# Patient Record
Sex: Male | Born: 1955 | Race: Black or African American | Hispanic: No | Marital: Married | Smoking: Former smoker
Health system: Southern US, Community
[De-identification: ages and names within clinical notes are randomized; demographics above are authoritative.]

## PROBLEM LIST (undated history)

## (undated) DIAGNOSIS — R972 Elevated prostate specific antigen [PSA]: Secondary | ICD-10-CM

## (undated) DIAGNOSIS — M1990 Unspecified osteoarthritis, unspecified site: Secondary | ICD-10-CM

## (undated) DIAGNOSIS — N529 Male erectile dysfunction, unspecified: Secondary | ICD-10-CM

## (undated) HISTORY — DX: Male erectile dysfunction, unspecified: N52.9

## (undated) HISTORY — DX: Elevated prostate specific antigen (PSA): R97.20

## (undated) HISTORY — DX: Unspecified osteoarthritis, unspecified site: M19.90

---

## 2013-05-13 ENCOUNTER — Emergency Department (HOSPITAL_COMMUNITY)
Admission: EM | Admit: 2013-05-13 | Discharge: 2013-05-13 | Disposition: A | Discharge status: Home Health | Payer: Managed Care, Other (non HMO) | Attending: Emergency Medicine | Admitting: Emergency Medicine

## 2013-05-13 ENCOUNTER — Encounter (HOSPITAL_COMMUNITY): Payer: Self-pay | Admitting: Emergency Medicine

## 2013-05-13 ENCOUNTER — Emergency Department (HOSPITAL_COMMUNITY): Payer: Managed Care, Other (non HMO)

## 2013-05-13 DIAGNOSIS — X58XXXA Exposure to other specified factors, initial encounter: Secondary | ICD-10-CM | POA: Insufficient documentation

## 2013-05-13 DIAGNOSIS — S0300XA Dislocation of jaw, unspecified side, initial encounter: Secondary | ICD-10-CM

## 2013-05-13 DIAGNOSIS — Y939 Activity, unspecified: Secondary | ICD-10-CM | POA: Insufficient documentation

## 2013-05-13 DIAGNOSIS — Y929 Unspecified place or not applicable: Secondary | ICD-10-CM | POA: Insufficient documentation

## 2013-05-13 MED ORDER — FENTANYL CITRATE 0.05 MG/ML IJ SOLN
100.0000 ug | Freq: Once | INTRAMUSCULAR | Status: AC
  Administered 2013-05-13: 50 ug via INTRAVENOUS

## 2013-05-13 MED ORDER — MIDAZOLAM HCL 2 MG/2ML IJ SOLN
INTRAMUSCULAR | Status: AC
  Administered 2013-05-13: 4 mg via INTRAVENOUS
  Filled 2013-05-13: qty 10

## 2013-05-13 MED ORDER — FLUMAZENIL 0.5 MG/5ML IV SOLN
INTRAVENOUS | Status: AC
  Administered 2013-05-13: 0.3 mg
  Filled 2013-05-13: qty 5

## 2013-05-13 MED ORDER — FENTANYL CITRATE 0.05 MG/ML IJ SOLN
INTRAMUSCULAR | Status: AC
  Administered 2013-05-13: 50 ug via INTRAVENOUS
  Filled 2013-05-13: qty 2

## 2013-05-13 MED ORDER — MIDAZOLAM HCL 2 MG/2ML IJ SOLN
1.0000 mg | Freq: Once | INTRAMUSCULAR | Status: AC
  Administered 2013-05-13: 1 mg via INTRAVENOUS

## 2013-05-13 MED ORDER — FENTANYL CITRATE 0.05 MG/ML IJ SOLN
50.0000 ug | Freq: Once | INTRAMUSCULAR | Status: AC
  Administered 2013-05-13: 50 ug via INTRAVENOUS

## 2013-05-13 MED ORDER — MIDAZOLAM HCL 2 MG/2ML IJ SOLN
4.0000 mg | Freq: Once | INTRAMUSCULAR | Status: AC
  Administered 2013-05-13: 4 mg via INTRAVENOUS

## 2013-05-13 MED ORDER — SODIUM CHLORIDE 0.9 % IV SOLN
INTRAVENOUS | Status: DC
  Administered 2013-05-13: 125 mL/h via INTRAVENOUS

## 2013-05-13 NOTE — ED Notes (Signed)
Pt taking po fluids without provlem.  States his pain is now relieved.

## 2013-05-13 NOTE — ED Notes (Signed)
Pt awake and talking with wife. Able to swallow and talk

## 2013-05-13 NOTE — ED Notes (Signed)
Patient denies pain and is resting comfortably.  

## 2013-05-13 NOTE — ED Provider Notes (Signed)
CSN: [id number]     Arrival date & time 05/13/13  1205 History   First MD Initiated Contact with Patient 05/13/13 1314     Chief Complaint  Patient presents with  . Jaw Pain   (Consider location/radiation/quality/duration/timing/severity/associated sxs/prior Treatment) The history is provided by the patient.   patient here complaining of inability to close his mouth. He went to the dentist office today to have filling and does have a prior history of jaw dislocation and open up his mouth he was unable to close it. Spoke with his dentist who says that he tried to use traction to relocate the jaw but was unsuccessful. Patient presents here now for definitive treatment  History reviewed. No pertinent past medical history. History reviewed. No pertinent past surgical history. History reviewed. No pertinent family history. History  Substance Use Topics  . Smoking status: Not on file  . Smokeless tobacco: Not on file  . Alcohol Use: No    Review of Systems  All other systems reviewed and are negative.    Allergies  Review of patient's allergies indicates not on file.  Home Medications  No current outpatient prescriptions on file. BP 156/88  Pulse 67  Temp(Src) 98.5 F (36.9 C) (Oral)  Resp 20  SpO2 97% Physical Exam  Nursing note and vitals reviewed. Constitutional: He is oriented to person, place, and time. He appears well-developed and well-nourished.  Non-toxic appearance. No distress.  HENT:  Head: Normocephalic and atraumatic.  Tender at the right condyle. Patient unable to close his mouth.  Eyes: Conjunctivae, EOM and lids are normal. Pupils are equal, round, and reactive to light.  Neck: Normal range of motion. Neck supple. No tracheal deviation present. No mass present.  Cardiovascular: Normal rate, regular rhythm and normal heart sounds.  Exam reveals no gallop.   No murmur heard. Pulmonary/Chest: Effort normal and breath sounds normal. No stridor. No respiratory  distress. He has no decreased breath sounds. He has no wheezes. He has no rhonchi. He has no rales.  Abdominal: Soft. Normal appearance and bowel sounds are normal. He exhibits no distension. There is no tenderness. There is no rebound and no CVA tenderness.  Musculoskeletal: Normal range of motion. He exhibits no edema and no tenderness.  Neurological: He is alert and oriented to person, place, and time. He has normal strength. No cranial nerve deficit or sensory deficit. GCS eye subscore is 4. GCS verbal subscore is 5. GCS motor subscore is 6.  Skin: Skin is warm and dry. No abrasion and no rash noted.  Psychiatric: He has a normal mood and affect. His speech is normal and behavior is normal.    ED Course  Procedural sedation Date/Time: 05/13/2013 2:57 PM Performed by: Toy Baker Authorized by: Lorre Nick T Consent: Verbal consent obtained. written consent obtained. Consent given by: patient Patient identity confirmed: verbally with patient Time out: Immediately prior to procedure a "time out" was called to verify the correct patient, procedure, equipment, support staff and site/side marked as required. Preparation: Patient was prepped and draped in the usual sterile fashion. Patient sedated: yes Sedatives: midazolam Analgesia: fentanyl Sedation start date/time: 05/13/2013 2:20 PM Sedation end date/time: 05/13/2013 2:35 PM Vitals: Vital signs were monitored during sedation. Patient tolerance: Patient tolerated the procedure well with no immediate complications.  Reduction of dislocation Date/Time: 05/13/2013 3:04 PM Performed by: Toy Baker Authorized by: Lorre Nick T Consent: Verbal consent obtained. written consent obtained. Risks and benefits: risks, benefits and alternatives were discussed Consent  given by: patient Patient identity confirmed: verbally with patient Patient tolerance: Patient tolerated the procedure well with no immediate complications.    (including critical care time) Labs Review Labs Reviewed - No data to display Imaging Review No results found.  MDM  No diagnosis found. 3:07 PM Patient is alert and oriented x4 at time of discharge. Airway is intact. He is able to take fluids without difficulty.    Toy Baker, MD 05/13/13 [phone]

## 2013-05-13 NOTE — ED Notes (Signed)
Procedure ended. Pt drowsy but arousable with physical stimuli

## 2013-05-13 NOTE — ED Notes (Signed)
1400 meds given per order of Dr. Freida Busman

## 2013-05-13 NOTE — ED Notes (Signed)
Consent signed for procedure, wife at bedside.

## 2013-05-13 NOTE — ED Notes (Signed)
Time out completed and procedure started

## 2013-05-13 NOTE — ED Notes (Signed)
Pt to monitor, O2 in place via [place] at 2lpm

## 2013-05-13 NOTE — ED Notes (Signed)
Wife's pt. Stated, he went to dentist this am and his jaw locked, they tried at the dentist office but unable to unlock it.

## 2013-05-13 NOTE — ED Notes (Signed)
Pt remains alert, oriented and awake, wife remains at bedside

## 2013-05-13 NOTE — ED Notes (Signed)
Pt awake and sipping drink

## 2018-01-17 ENCOUNTER — Ambulatory Visit: Payer: 59 | Admitting: Family Medicine

## 2018-01-17 ENCOUNTER — Telehealth: Payer: Self-pay | Admitting: Family Medicine

## 2018-01-17 ENCOUNTER — Encounter: Payer: Self-pay | Admitting: Family Medicine

## 2018-01-17 VITALS — BP 126/80 | HR 81 | Ht 72.0 in | Wt 170.4 lb

## 2018-01-17 DIAGNOSIS — N529 Male erectile dysfunction, unspecified: Secondary | ICD-10-CM | POA: Diagnosis not present

## 2018-01-17 DIAGNOSIS — Z1211 Encounter for screening for malignant neoplasm of colon: Secondary | ICD-10-CM | POA: Insufficient documentation

## 2018-01-17 DIAGNOSIS — Z0001 Encounter for general adult medical examination with abnormal findings: Secondary | ICD-10-CM

## 2018-01-17 DIAGNOSIS — Z0000 Encounter for general adult medical examination without abnormal findings: Secondary | ICD-10-CM | POA: Diagnosis not present

## 2018-01-17 LAB — CBC
HCT: 41.7 % (ref 39.0–52.0)
Hemoglobin: 13.8 g/dL (ref 13.0–17.0)
MCHC: 33 g/dL (ref 30.0–36.0)
MCV: 89 fl (ref 78.0–100.0)
PLATELETS: 223 10*3/uL (ref 150.0–400.0)
RBC: 4.69 Mil/uL (ref 4.22–5.81)
RDW: 14 % (ref 11.5–15.5)
WBC: 3.9 10*3/uL — ABNORMAL LOW (ref 4.0–10.5)

## 2018-01-17 LAB — LIPID PANEL
CHOLESTEROL: 141 mg/dL (ref 0–200)
HDL: 55 mg/dL (ref >39.00)
LDL CALC: 77 mg/dL (ref 0–99)
NonHDL: 86.37
Total CHOL/HDL Ratio: 3
Triglycerides: 49 mg/dL (ref 0.0–149.0)
VLDL: 9.8 mg/dL (ref 0.0–40.0)

## 2018-01-17 LAB — URINALYSIS, ROUTINE W REFLEX MICROSCOPIC
Bilirubin Urine: NEGATIVE
HGB URINE DIPSTICK: NEGATIVE
KETONES UR: NEGATIVE
LEUKOCYTES UA: NEGATIVE
NITRITE: NEGATIVE
RBC / HPF: NONE SEEN (ref 0–?)
SPECIFIC GRAVITY, URINE: 1.02 (ref 1.000–1.030)
Total Protein, Urine: NEGATIVE
Urine Glucose: NEGATIVE
Urobilinogen, UA: 1 (ref 0.0–1.0)
pH: 6.5 (ref 5.0–8.0)

## 2018-01-17 LAB — COMPREHENSIVE METABOLIC PANEL
ALBUMIN: 3.9 g/dL (ref 3.5–5.2)
ALK PHOS: 45 U/L (ref 39–117)
ALT: 11 U/L (ref 0–53)
AST: 13 U/L (ref 0–37)
BUN: 12 mg/dL (ref 6–23)
CALCIUM: 9 mg/dL (ref 8.4–10.5)
CHLORIDE: 104 meq/L (ref 96–112)
CO2: 32 mEq/L (ref 19–32)
CREATININE: 1.02 mg/dL (ref 0.40–1.50)
GFR: 95.9 mL/min (ref >60.00)
Glucose, Bld: 99 mg/dL (ref 70–99)
Potassium: 4.6 mEq/L (ref 3.5–5.1)
Sodium: 140 mEq/L (ref 135–145)
Total Bilirubin: 0.7 mg/dL (ref 0.2–1.2)
Total Protein: 6.2 g/dL (ref 6.0–8.3)

## 2018-01-17 LAB — PSA: PSA: 0.98 ng/mL (ref 0.10–4.00)

## 2018-01-17 MED ORDER — SILDENAFIL CITRATE 20 MG PO TABS: ORAL_TABLET | ORAL | 0 refills | Status: DC

## 2018-01-17 NOTE — Patient Instructions (Signed)
Erectile Dysfunction Erectile dysfunction (ED) is the inability to get or keep an erection in order to have sexual intercourse. Erectile dysfunction may include:  Inability to get an erection.  Lack of enough hardness of the erection to allow penetration.  Loss of the erection before sex is finished.  What are the causes? This condition may be caused by:  Certain medicines, such as: ? Pain relievers. ? Antihistamines. ? Antidepressants. ? Blood pressure medicines. ? Water pills (diuretics). ? Ulcer medicines. ? Muscle relaxants. ? Drugs.  Excessive drinking.  Psychological causes, such as: ? Anxiety. ? Depression. ? Sadness. ? Exhaustion. ? Performance fear. ? Stress.  Physical causes, such as: ? Artery problems. This may include diabetes, smoking, liver disease, or atherosclerosis. ? High blood pressure. ? Hormonal problems, such as low testosterone. ? Obesity. ? Nerve problems. This may include back or pelvic injuries, diabetes mellitus, multiple sclerosis, or Parkinson disease.  What are the signs or symptoms? Symptoms of this condition include:  Inability to get an erection.  Lack of enough hardness of the erection to allow penetration.  Loss of the erection before sex is finished.  Normal erections at some times, but with frequent unsatisfactory episodes.  Low sexual satisfaction in either partner due to erection problems.  A curved penis occurring with erection. The curve may cause pain or the penis may be too curved to allow for intercourse.  Never having nighttime erections.  How is this diagnosed? This condition is often diagnosed by:  Performing a physical exam to find other diseases or specific problems with the penis.  Asking you detailed questions about the problem.  Performing blood tests to check for diabetes mellitus or to measure hormone levels.  Performing other tests to check for underlying health conditions.  Performing an  ultrasound exam to check for scarring.  Performing a test to check blood flow to the penis.  Doing a sleep study at home to measure nighttime erections.  How is this treated? This condition may be treated by:  Medicine taken by mouth to help you achieve an erection (oral medicine).  Hormone replacement therapy to replace low testosterone levels.  Medicine that is injected into the penis. Your health care provider may instruct you how to give yourself these injections at home.  Vacuum pump. This is a pump with a ring on it. The pump and ring are placed on the penis and used to create pressure that helps the penis become erect.  Penile implant surgery. In this procedure, you may receive: ? An inflatable implant. This consists of cylinders, a pump, and a reservoir. The cylinders can be inflated with a fluid that helps to create an erection, and they can be deflated after intercourse. ? A semi-rigid implant. This consists of two silicone rubber rods. The rods provide some rigidity. They are also flexible, so the penis can both curve downward in its normal position and become straight for sexual intercourse.  Blood vessel surgery, to improve blood flow to the penis. During this procedure, a blood vessel from a different part of the body is placed into the penis to allow blood to flow around (bypass) damaged or blocked blood vessels.  Lifestyle changes, such as exercising more, losing weight, and quitting smoking.  Follow these instructions at home: Medicines  Take over-the-counter and prescription medicines only as told by your health care provider. Do not increase the dosage without first discussing it with your health care provider.  If you are using self-injections, perform injections   as directed by your health care provider. Make sure to avoid any veins that are on the surface of the penis. After giving an injection, apply pressure to the injection site for 5 minutes. General  instructions  Exercise regularly, as directed by your health care provider. Work with your health care provider to lose weight, if needed.  Do not use any products that contain nicotine or tobacco, such as cigarettes and e-cigarettes. If you need help quitting, ask your health care provider.  Before using a vacuum pump, read the instructions that come with the pump and discuss any questions with your health care provider.  Keep all follow-up visits as told by your health care provider. This is important. Contact a health care provider if:  You feel nauseous.  You vomit. Get help right away if:  You are taking oral or injectable medicines and you have an erection that lasts longer than 4 hours. If your health care provider is unavailable, go to the nearest emergency room for evaluation. An erection that lasts much longer than 4 hours can result in permanent damage to your penis.  You have severe pain in your groin or abdomen.  You develop redness or severe swelling of your penis.  You have redness spreading up into your groin or lower abdomen.  You are unable to urinate.  You experience chest pain or a rapid heart beat (palpitations) after taking oral medicines. Summary  Erectile dysfunction (ED) is the inability to get or keep an erection during sexual intercourse. This problem can usually be treated successfully.  This condition is diagnosed based on a physical exam, your symptoms, and tests to determine the cause. Treatment varies depending on the cause, and may include medicines, hormone therapy, surgery, or vacuum pump.  You may need follow-up visits to make sure that you are using your medicines or devices correctly.  Get help right away if you are taking or injecting medicines and you have an erection that lasts longer than 4 hours. This information is not intended to replace advice given to you by your health care provider. Make sure you discuss any questions you have with  your health care provider. Document Released: 08/21/2000 Document Revised: 09/09/2016 Document Reviewed: 09/09/2016 Elsevier Interactive Patient Education  2017 Elsevier Inc.  Health Maintenance, Male A healthy lifestyle and preventive care is important for your health and wellness. Ask your health care provider about what schedule of regular examinations is right for you. What should I know about weight and diet? Eat a Healthy Diet  Eat plenty of vegetables, fruits, whole grains, low-fat dairy products, and lean protein.  Do not eat a lot of foods high in solid fats, added sugars, or salt.  Maintain a Healthy Weight Regular exercise can help you achieve or maintain a healthy weight. You should:  Do at least 150 minutes of exercise each week. The exercise should increase your heart rate and make you sweat (moderate-intensity exercise).  Do strength-training exercises at least twice a week.  Watch Your Levels of Cholesterol and Blood Lipids  Have your blood tested for lipids and cholesterol every 5 years starting at 60 years of age. If you are at high risk for heart disease, you should start having your blood tested when you are 60 years old. You may need to have your cholesterol levels checked more often if: ? Your lipid or cholesterol levels are high. ? You are older than 60 years of age. ? You are at high risk for heart   disease.  What should I know about cancer screening? Many types of cancers can be detected early and may often be prevented. Lung Cancer  You should be screened every year for lung cancer if: ? You are a current smoker who has smoked for at least 30 years. ? You are a former smoker who has quit within the past 15 years.  Talk to your health care provider about your screening options, when you should start screening, and how often you should be screened.  Colorectal Cancer  Routine colorectal cancer screening usually begins at 60 years of age and should be  repeated every 5-10 years until you are 60 years old. You may need to be screened more often if early forms of precancerous polyps or small growths are found. Your health care provider may recommend screening at an earlier age if you have risk factors for colon cancer.  Your health care provider may recommend using home test kits to check for hidden blood in the stool.  A small camera at the end of a tube can be used to examine your colon (sigmoidoscopy or colonoscopy). This checks for the earliest forms of colorectal cancer.  Prostate and Testicular Cancer  Depending on your age and overall health, your health care provider may do certain tests to screen for prostate and testicular cancer.  Talk to your health care provider about any symptoms or concerns you have about testicular or prostate cancer.  Skin Cancer  Check your skin from head to toe regularly.  Tell your health care provider about any new moles or changes in moles, especially if: ? There is a change in a mole's size, shape, or color. ? You have a mole that is larger than a pencil eraser.  Always use sunscreen. Apply sunscreen liberally and repeat throughout the day.  Protect yourself by wearing long sleeves, pants, a wide-brimmed hat, and sunglasses when outside.  What should I know about heart disease, diabetes, and high blood pressure?  If you are 18-39 years of age, have your blood pressure checked every 3-5 years. If you are 40 years of age or older, have your blood pressure checked every year. You should have your blood pressure measured twice-once when you are at a hospital or clinic, and once when you are not at a hospital or clinic. Record the average of the two measurements. To check your blood pressure when you are not at a hospital or clinic, you can use: ? An automated blood pressure machine at a pharmacy. ? A home blood pressure monitor.  Talk to your health care provider about your target blood  pressure.  If you are between 45-79 years old, ask your health care provider if you should take aspirin to prevent heart disease.  Have regular diabetes screenings by checking your fasting blood sugar level. ? If you are at a normal weight and have a low risk for diabetes, have this test once every three years after the age of 45. ? If you are overweight and have a high risk for diabetes, consider being tested at a younger age or more often.  A one-time screening for abdominal aortic aneurysm (AAA) by ultrasound is recommended for men aged 65-75 years who are current or former smokers. What should I know about preventing infection? Hepatitis B If you have a higher risk for hepatitis B, you should be screened for this virus. Talk with your health care provider to find out if you are at risk for hepatitis B   infection. Hepatitis C Blood testing is recommended for:  Everyone born from 1945 through 1965.  Anyone with known risk factors for hepatitis C.  Sexually Transmitted Diseases (STDs)  You should be screened each year for STDs including gonorrhea and chlamydia if: ? You are sexually active and are younger than 60 years of age. ? You are older than 60 years of age and your health care provider tells you that you are at risk for this type of infection. ? Your sexual activity has changed since you were last screened and you are at an increased risk for chlamydia or gonorrhea. Ask your health care provider if you are at risk.  Talk with your health care provider about whether you are at high risk of being infected with HIV. Your health care provider may recommend a prescription medicine to help prevent HIV infection.  What else can I do?  Schedule regular health, dental, and eye exams.  Stay current with your vaccines (immunizations).  Do not use any tobacco products, such as cigarettes, chewing tobacco, and e-cigarettes. If you need help quitting, ask your health care  provider.  Limit alcohol intake to no more than 2 drinks per day. One drink equals 12 ounces of beer, 5 ounces of wine, or 1 ounces of hard liquor.  Do not use street drugs.  Do not share needles.  Ask your health care provider for help if you need support or information about quitting drugs.  Tell your health care provider if you often feel depressed.  Tell your health care provider if you have ever been abused or do not feel safe at home. This information is not intended to replace advice given to you by your health care provider. Make sure you discuss any questions you have with your health care provider. Document Released: 02/20/2008 Document Revised: 04/22/2016 Document Reviewed: 05/28/2015 Elsevier Interactive Patient Education  2018 Elsevier Inc.  

## 2018-01-17 NOTE — Telephone Encounter (Signed)
Copied from CRM [id number]. Topic: Quick Communication - Rx Refill/Question >> Jan 17, 2018  4:04 PM Landry Mellow wrote: Medication: sildenafil (REVATIO) 20 MG tablet    Has the patient contacted their pharmacy? Yes.  Pharm called  (Agent: If no, request that the patient contact the pharmacy for the refill.) Preferred Pharmacy (with phone number or street name): [facility] - [place], [place] [address] Agent: Please be advised that RX refills may take up to 3 business days. We ask that you follow-up with your pharmacy.  Medication was denied at [facility] because is has to be filled through this pharmacy instead in order to be covered.

## 2018-01-17 NOTE — Progress Notes (Signed)
Subjective:  Patient ID: Raymond Stein, male    DOB: [birth year]  Age: 62 y.o. MRN: [id number]  CC: Establish Care   HPI Raymond Stein presents for a physical exam.  He is accompanied by his wife.  Their children are grown and some of them live locally.  He does not smoke, drink alcohol or use illicit drugs.  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  He is active on his job but has no regular exercise program.  His father lived to age 57 before having a massive heart attack.  His mom died at age 3 from uterine cancer.  He had a colonoscopy 5 years ago.  His urine flow is good he does not have nocturia.  He is experiencing some issues with achieving an erection.  His libido is excellent.  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  They have a child who is a type I diabetic.  History Raymond Stein has no past medical history on file.   He has no past surgical history on file.   His family history is not on file.He reports that he has quit smoking. He has never used smokeless tobacco. He reports that he does not drink alcohol. His drug history is not on file.  No outpatient medications prior to visit.   No facility-administered medications prior to visit.     ROS Review of Systems  Constitutional: Negative for chills, fatigue, fever and unexpected weight change.  HENT: Negative.   Eyes: Negative for photophobia and visual disturbance.  Respiratory: Negative for apnea, cough and wheezing.   Cardiovascular: Negative for chest pain and palpitations.  Gastrointestinal: Negative.   Endocrine: Negative for polyphagia and polyuria.  Genitourinary: Negative for difficulty urinating, frequency and urgency.  Musculoskeletal: Negative for arthralgias and myalgias.  Skin: Negative for color change and rash.  Allergic/Immunologic: Negative for immunocompromised state.  Neurological: Negative for weakness, numbness and headaches.  Hematological: Does not bruise/bleed easily.    Psychiatric/Behavioral: Negative.     Objective:  BP 126/80   Pulse 81   Ht 6' (1.829 m)   Wt 170 lb 6 oz (77.3 kg)   SpO2 98%   BMI 23.11 kg/m   Physical Exam  Constitutional: He is oriented to person, place, and time. He appears well-developed and well-nourished. No distress.  HENT:  Head: Normocephalic and atraumatic.  Right Ear: External ear normal.  Left Ear: External ear normal.  Nose: Nose normal.  Mouth/Throat: Oropharynx is clear and moist. No oropharyngeal exudate.  Eyes: Pupils are equal, round, and reactive to light. Conjunctivae and EOM are normal. Right eye exhibits no discharge. Left eye exhibits no discharge. No scleral icterus.  Neck: Normal range of motion. Neck supple. No JVD present. No tracheal deviation present. No thyromegaly present.  Cardiovascular: Normal rate, regular rhythm and normal heart sounds.  Pulmonary/Chest: Effort normal and breath sounds normal.  Abdominal: Soft. Bowel sounds are normal. He exhibits no distension. There is no tenderness. There is no guarding.  Genitourinary: Rectal exam shows no external hemorrhoid, no internal hemorrhoid, no fissure, no mass, no tenderness, anal tone normal and guaiac negative stool. Prostate is enlarged. Prostate is not tender.  Musculoskeletal: He exhibits no edema or deformity.  Lymphadenopathy:    He has no cervical adenopathy.  Neurological: He is alert and oriented to person, place, and time.  Skin: Skin is warm and dry. Capillary refill takes less than 2 seconds. No rash noted. He is not diaphoretic.  No erythema. No pallor.  Psychiatric: He has a normal mood and affect. His behavior is normal.      Assessment & Plan:   Raymond Stein was seen today for establish care.  Diagnoses and all orders for this visit:  Encounter for health maintenance examination with abnormal findings -     CBC -     Comprehensive metabolic panel -     HIV antibody -     Lipid panel -     PSA -     Urinalysis, Routine w  reflex microscopic  Vasculogenic erectile dysfunction, unspecified vasculogenic erectile dysfunction type -     Lipid panel -     PSA -     sildenafil (REVATIO) 20 MG tablet; Take 1-3 tablets 45 minutes prior   I am having Raymond Stein start on sildenafil.  Meds ordered this encounter  Medications  . sildenafil (REVATIO) 20 MG tablet    Sig: Take 1-3 tablets 45 minutes prior    Dispense:  50 tablet    Refill:  0   Encouraged the patient to start a dedicated exercise program for 30 minutes at least 5 days a week.  Information for prevention and health maintenance was given to the patient as well.  Follow-up: Return in about 3 months (around 04/19/2018).  Mliss Sax, MD

## 2018-01-17 NOTE — Telephone Encounter (Unsigned)
Copied from CRM #[id number]. Topic: Quick Communication - Rx Refill/Question °>> Jan 17, 2018  4:04 PM Foltz, Melissa J wrote: °Medication: sildenafil (REVATIO) 20 MG tablet  ° ° °Has the patient contacted their pharmacy? Yes.  Pharm called  °(Agent: If no, request that the patient contact the pharmacy for the refill.) °Preferred Pharmacy (with phone number or street name): [facility] - [place], [place] - [address] °Agent: Please be advised that RX refills may take up to 3 business days. We ask that you follow-up with your pharmacy. ° °Medication was denied at [facility] because is has to be filled through this pharmacy instead in order to be covered.  °

## 2018-01-18 ENCOUNTER — Other Ambulatory Visit: Payer: Self-pay

## 2018-01-18 ENCOUNTER — Telehealth: Payer: Self-pay | Admitting: Family Medicine

## 2018-01-18 DIAGNOSIS — N529 Male erectile dysfunction, unspecified: Secondary | ICD-10-CM

## 2018-01-18 LAB — HIV ANTIBODY (ROUTINE TESTING W REFLEX): HIV 1&2 Ab, 4th Generation: NONREACTIVE

## 2018-01-18 MED ORDER — SILDENAFIL CITRATE 20 MG PO TABS: ORAL_TABLET | ORAL | 0 refills | Status: DC

## 2018-01-18 NOTE — Telephone Encounter (Signed)
Copied from CRM #[id number]. Topic: Quick Communication - See Telephone Encounter >> Jan 18, 2018  9:57 AM Rudi Coco, NT wrote: CRM for notification. See Telephone encounter for: 01/18/18.  Pre auth needed for med. sildenafil (REVATIO) 20 MG tablet [[id number]]   [facility] - [place], [place] - [address] [place] [address] Phone: [phone] Fax: [phone]

## 2018-01-18 NOTE — Telephone Encounter (Signed)
PA is pending via covermymeds. Key: [id number]  Please allow at least 72 hours for a response from insurance company.

## 2018-01-18 NOTE — Telephone Encounter (Signed)
Duplicate encounter

## 2018-01-18 NOTE — Telephone Encounter (Addendum)
I called and spoke with patient's preferred pharmacy. Rx given to pharmacist verbally over the phone. They will go ahead and process the request, and let us know if a PA is needed. I also called patient and left him a voicemail to return our call to the office. Triage nurse may discuss the information below with patient.  [facility] advised that the Sildenafil will most likely not be covered, if that is the case, we can attempt a PA. The PA will probably be denied due to the diagnosis, if this is the case, patient will have to pay out of pocket for the Rx.

## 2018-01-18 NOTE — Telephone Encounter (Signed)
Pt and wife called stating [facility] stated PA is required for the sildenafil . She would like [place] to proceed and try to obtain PA. She states [employer] provider line is [phone].

## 2018-01-18 NOTE — Telephone Encounter (Signed)
PA has been denied, left patient a voicemail letting him know.

## 2018-01-18 NOTE — Telephone Encounter (Signed)
Medication filled on 01/18/18

## 2018-01-20 NOTE — Telephone Encounter (Signed)
I spoke with patient's wife. We went over [facility] as an option for patient to use since insurance will not cover the medication. I also gave patient's wife the lab results as well and she verbalized understanding & will give the results to the patient.

## 2018-01-20 NOTE — Telephone Encounter (Addendum)
Patient calling and states the insurance denied medication because it is not FDA approved. And for him to contact his PCP. He is not sure why he is contacting the office. He will have his wife call with more details. He would also like a call to go over his lab results.

## 2018-01-20 NOTE — Telephone Encounter (Signed)
Attempted to contact patient, there was no answer. PA is denied, most insurance companies do not cover the Sildenafil. Lab results were normal, triage nurse may go over results with patient.

## 2018-03-18 ENCOUNTER — Encounter: Payer: Self-pay | Admitting: Family Medicine

## 2018-10-03 ENCOUNTER — Ambulatory Visit: Payer: 59 | Admitting: Family Medicine

## 2018-10-07 ENCOUNTER — Ambulatory Visit: Payer: 59 | Admitting: Family Medicine

## 2018-10-07 ENCOUNTER — Encounter: Payer: Self-pay | Admitting: Family Medicine

## 2018-10-07 VITALS — BP 120/70 | HR 99 | Temp 98.3°F | Ht 72.0 in | Wt 157.5 lb

## 2018-10-07 DIAGNOSIS — Z8701 Personal history of pneumonia (recurrent): Secondary | ICD-10-CM | POA: Insufficient documentation

## 2018-10-07 DIAGNOSIS — Z87448 Personal history of other diseases of urinary system: Secondary | ICD-10-CM

## 2018-10-07 LAB — URINALYSIS, ROUTINE W REFLEX MICROSCOPIC
Bilirubin Urine: NEGATIVE
GLUCOSE, UA: NEGATIVE
Hgb urine dipstick: NEGATIVE
KETONES UR: NEGATIVE
LEUKOCYTES UA: NEGATIVE
NITRITE: NEGATIVE
PH: 6.5 (ref 5.0–8.0)
PROTEIN: NEGATIVE
Specific Gravity, Urine: 1.009 (ref 1.001–1.03)

## 2018-10-07 MED ORDER — BENZONATATE 200 MG PO CAPS: 200.0000 mg | ORAL_CAPSULE | Freq: Two times a day (BID) | ORAL | 0 refills | Status: DC | PRN

## 2018-10-07 NOTE — Progress Notes (Signed)
Established Patient Office Visit  Subjective:  Patient ID: Raymond Stein, male    DOB: Oct 21, 1955  Age: 63 y.o. MRN: [id number]  CC:  Chief Complaint  Patient presents with  . Follow-up    HPI Raymond Stein presents for follow-up of flu and pneumonia with reactive airway disease.  Patient developed flu symptoms Thursday a week ago and was seen in urgent care where he was diagnosed with the flu and started on Tamiflu.  He felt worse and was seen 2 days later and diagnosed with a pneumonia and started also on levofloxacin.  He was given an inhaler for wheezing.  He has no history of asthma.  He quit smoking 30 years ago.  He still is feeling some malaise and fatigue.  Cough persists but he is no longer febrile or his wheezing.  He was told that his urine had shown blood and was advised to follow-up with me.  Patient has no symptoms of BPH to include nocturia pre-or post void dribbling.  His urine stream is good.  PSA and urinalysis in May were normal History reviewed. No pertinent past medical history.  History reviewed. No pertinent surgical history.  History reviewed. No pertinent family history.  Social History   Socioeconomic History  . Marital status: Married    Spouse name: Not on file  . Number of children: Not on file  . Years of education: Not on file  . Highest education level: Not on file  Occupational History  . Not on file  Social Needs  . Financial resource strain: Not on file  . Food insecurity:    Worry: Not on file    Inability: Not on file  . Transportation needs:    Medical: Not on file    Non-medical: Not on file  Tobacco Use  . Smoking status: Former Games developermoker  . Smokeless tobacco: Never Used  Substance and Sexual Activity  . Alcohol use: No  . Drug use: Not on file  . Sexual activity: Not on file  Lifestyle  . Physical activity:    Days per week: Not on file    Minutes per session: Not on file  . Stress: Not on file  Relationships  . Social  connections:    Talks on phone: Not on file    Gets together: Not on file    Attends religious service: Not on file    Active member of club or organization: Not on file    Attends meetings of clubs or organizations: Not on file    Relationship status: Not on file  . Intimate partner violence:    Fear of current or ex partner: Not on file    Emotionally abused: Not on file    Physically abused: Not on file    Forced sexual activity: Not on file  Other Topics Concern  . Not on file  Social History Narrative  . Not on file    Outpatient Medications Prior to Visit  Medication Sig Dispense Refill  . sildenafil (REVATIO) 20 MG tablet Take 1-3 tablets 45 minutes prior 50 tablet 0   No facility-administered medications prior to visit.     No Known Allergies  ROS Review of Systems  Constitutional: Positive for fatigue. Negative for chills, diaphoresis, fever and unexpected weight change.  HENT: Negative.   Eyes: Negative for photophobia and visual disturbance.  Respiratory: Positive for cough. Negative for shortness of breath and wheezing.   Cardiovascular: Negative.   Gastrointestinal: Negative.  Endocrine: Negative for polyphagia and polyuria.  Genitourinary: Negative for difficulty urinating, frequency and urgency.  Musculoskeletal: Negative for gait problem and joint swelling.  Skin: Negative for pallor.  Allergic/Immunologic: Negative for immunocompromised state.  Neurological: Negative for speech difficulty, numbness and headaches.  Hematological: Does not bruise/bleed easily.  Psychiatric/Behavioral: Negative.       Objective:    Physical Exam  Constitutional: He is oriented to person, place, and time. He appears well-developed and well-nourished. No distress.  HENT:  Head: Normocephalic and atraumatic.  Right Ear: External ear normal.  Left Ear: External ear normal.  Mouth/Throat: Oropharynx is clear and moist. No oropharyngeal exudate.  Eyes: Pupils are equal,  round, and reactive to light. Conjunctivae are normal. Right eye exhibits no discharge. Left eye exhibits no discharge. No scleral icterus.  Neck: Neck supple. No JVD present. No tracheal deviation present. No thyromegaly present.  Cardiovascular: Normal rate, regular rhythm and normal heart sounds.  Pulmonary/Chest: Effort normal. No stridor. No respiratory distress. He has no wheezes. He has no rales.  Abdominal: Bowel sounds are normal.  Lymphadenopathy:    He has no cervical adenopathy.  Neurological: He is alert and oriented to person, place, and time.  Skin: Skin is warm and dry. He is not diaphoretic.  Psychiatric: He has a normal mood and affect. His behavior is normal.    BP 120/70   Pulse 99   Temp 98.3 F (36.8 C) (Oral)   Ht 6' (1.829 m)   Wt 157 lb 8 oz (71.4 kg)   SpO2 97%   BMI 21.36 kg/m  Wt Readings from Last 3 Encounters:  10/07/18 157 lb 8 oz (71.4 kg)  01/17/18 170 lb 6 oz (77.3 kg)   BP Readings from Last 3 Encounters:  10/07/18 120/70  01/17/18 126/80  05/13/13 128/69   Guideline developer:  UpToDate (see UpToDate for funding source) Date Released: June 2014  Health Maintenance Due  Topic Date Due  . Hepatitis C Screening  [birth year]  . TETANUS/TDAP  04/27/1977  . COLONOSCOPY  04/27/2008  . INFLUENZA VACCINE  04/07/2018    There are no preventive care reminders to display for this patient.  No results found for: TSH Lab Results  Component Value Date   WBC 3.9 (L) 01/17/2018   HGB 13.8 01/17/2018   HCT 41.7 01/17/2018   MCV 89.0 01/17/2018   PLT 223.0 01/17/2018   Lab Results  Component Value Date   NA 140 01/17/2018   K 4.6 01/17/2018   CO2 32 01/17/2018   GLUCOSE 99 01/17/2018   BUN 12 01/17/2018   CREATININE 1.02 01/17/2018   BILITOT 0.7 01/17/2018   ALKPHOS 45 01/17/2018   AST 13 01/17/2018   ALT 11 01/17/2018   PROT 6.2 01/17/2018   ALBUMIN 3.9 01/17/2018   CALCIUM 9.0 01/17/2018   GFR 95.90 01/17/2018   Lab Results    Component Value Date   CHOL 141 01/17/2018   Lab Results  Component Value Date   HDL 55.00 01/17/2018   Lab Results  Component Value Date   LDLCALC 77 01/17/2018   Lab Results  Component Value Date   TRIG 49.0 01/17/2018   Lab Results  Component Value Date   CHOLHDL 3 01/17/2018   No results found for: HGBA1C    Assessment & Plan:   Problem List Items Addressed This Visit      Other   History of hematuria - Primary   Relevant Orders   Urinalysis, Routine w reflex microscopic  History of pneumonia   Relevant Medications   benzonatate (TESSALON) 200 MG capsule      Meds ordered this encounter  Medications  . benzonatate (TESSALON) 200 MG capsule    Sig: Take 1 capsule (200 mg total) by mouth 2 (two) times daily as needed for cough.    Dispense:  20 capsule    Refill:  0    Follow-up: Return in about 1 month (around 11/05/2018), or if symptoms worsen or fail to improve.   Patient is afebrile today and I heard no rales or rhonchi or wheezing on exam.  Patient will finish all medicines as directed.  Out of work through Monday.  I do not have access to patient's chest x-ray results.  Patient will follow-up in 1 month for recheck of his urine and a repeat chest x-ray.  He will follow-up sooner if worse.

## 2018-10-17 ENCOUNTER — Telehealth: Payer: Self-pay

## 2018-10-17 NOTE — Telephone Encounter (Signed)
Urine was normal. How is he feeling. Remind him to rtc in march and wewill recheck his cxr.

## 2018-10-17 NOTE — Telephone Encounter (Signed)
Copied from CRM [id number]. Topic: General - Other >> Oct 17, 2018 11:27 AM Raymond Stein wrote: Reason for CRM: Pt stated he has not been contacted with the results of the urine screen. Pt requests a call back with the results. Cb# [phone]

## 2018-10-17 NOTE — Telephone Encounter (Signed)
I left patient a voicemail to return my call, crm created.

## 2018-10-17 NOTE — Telephone Encounter (Signed)
Pt. returned call for results of urinalysis done 10/07/18.  Advised of result notes per Dr. Doreene Burke on 10/17/18 at 12:05 PM.  Pt. Verb. Understanding.  Stated he is feeling fine; no complaints offered.  Reminded of appt. Scheduled on 11/07/18 @ 1:30 PM; verb. Understanding.

## 2018-10-17 NOTE — Telephone Encounter (Signed)
fyi

## 2018-11-07 ENCOUNTER — Ambulatory Visit (INDEPENDENT_AMBULATORY_CARE_PROVIDER_SITE_OTHER): Payer: 59

## 2018-11-07 ENCOUNTER — Encounter: Payer: Self-pay | Admitting: Family Medicine

## 2018-11-07 ENCOUNTER — Ambulatory Visit: Payer: 59 | Admitting: Family Medicine

## 2018-11-07 VITALS — BP 128/78 | HR 83 | Ht 72.0 in | Wt 160.0 lb

## 2018-11-07 DIAGNOSIS — Z8701 Personal history of pneumonia (recurrent): Secondary | ICD-10-CM

## 2018-11-07 DIAGNOSIS — M25512 Pain in left shoulder: Secondary | ICD-10-CM | POA: Insufficient documentation

## 2018-11-07 DIAGNOSIS — Z23 Encounter for immunization: Secondary | ICD-10-CM | POA: Diagnosis not present

## 2018-11-07 MED ORDER — MELOXICAM 15 MG PO TABS: 15.0000 mg | ORAL_TABLET | Freq: Every day | ORAL | 0 refills | Status: DC

## 2018-11-07 NOTE — Patient Instructions (Signed)
Shoulder Range of Motion Exercises  Shoulder range of motion (ROM) exercises are done to keep the shoulder moving freely or to increase movement. They are often recommended for people who have shoulder pain or stiffness or who are recovering from a shoulder surgery.  Phase 1 exercises  When you are able, do this exercise 1-2 times per day for 30-60 seconds in each direction, or as directed by your health care provider.  Pendulum exercise  To do this exercise while sitting:  1. Sit in a chair or at the edge of your bed with your feet flat on the floor.  2. Let your affected arm hang down in front of you over the edge of the bed or chair.  3. Relax your shoulder, arm, and hand.  4. Rock your body so your arm gently swings in small circles. You can also use your unaffected arm to start the motion.  5. Repeat changing the direction of the circles, swinging your arm left and right, and swinging your arm forward and back.  To do this exercise while standing:  1. Stand next to a sturdy chair or table, and hold on to it with your hand on your unaffected side.  2. Bend forward at the waist.  3. Bend your knees slightly.  4. Relax your shoulder, arm, and hand.  5. While keeping your shoulder relaxed, use body motion to swing your arm in small circles.  6. Repeat changing the direction of the circles, swinging your arm left and right, and swinging your arm forward and back.  7. Between exercises, stand up tall and take a short break to relax your lower back.    Phase 2 exercises  Do these exercises 1-2 times per day or as told by your health care provider. Hold each stretch for 30 seconds, and repeat 3 times. Do the exercises with one or both arms as instructed by your health care provider.  For these exercises, sit at a table with your hand and arm supported by the table. A chair that slides easily or has wheels can be helpful.  External rotation  1. Turn your chair so that your affected side is nearest to the  table.  2. Place your forearm on the table to your side. Bend your elbow about 90 at the elbow (right angle) and place your hand palm facing down on the table. Your elbow should be about 6 inches away from your side.  3. Keeping your arm on the table, lean your body forward.  Abduction  1. Turn your chair so that your affected side is nearest to the table.  2. Place your forearm and hand on the table so that your thumb points toward the ceiling and your arm is straight out to your side.  3. Slide your hand out to the side and away from you, using your unaffected arm to do the work.  4. To increase the stretch, you can slide your chair away from the table.  Flexion: forward stretch  1. Sit facing the table. Place your hand and elbow on the table in front of you.  2. Slide your hand forward and away from you, using your unaffected arm to do the work.  3. To increase the stretch, you can slide your chair backward.  Phase 3 exercises  Do these exercises 1-2 times per day or as told by your health care provider. Hold each stretch for 30 seconds, and repeat 3 times. Do the exercises with one or   both arms as instructed by your health care provider.  Cross-body stretch: posterior capsule stretch  1. Lift your arm straight out in front of you.  2. Bend your arm 90 at the elbow (right angle) so your forearm moves across your body.  3. Use your other arm to gently pull the elbow across your body, toward your other shoulder.  Wall climbs  1. Stand with your affected arm extended out to the side with your hand resting on a door frame.  2. Slide your hand slowly up the door frame.  3. To increase the stretch, step through the door frame. Keep your body upright and do not lean.  Wand exercises  You will need a cane, a piece of PVC pipe, or a sturdy wooden dowel for wand exercises.  Flexion  To do this exercise while standing:  1. Hold the wand with both of your hands, palms down.  2. Using the other arm to help, lift your arms  up and over your head, if able.  3. Push upward with your other arm to gently increase the stretch.  To do this exercise while lying down:  1. Lie on your back with your elbows resting on the floor and the wand in both your hands. Your hands will be palm down, or pointing toward your feet.  2. Lift your hands toward the ceiling, using your unaffected arm to help if needed.  3. Bring your arms overhead as able, using your unaffected arm to help if needed.  Internal rotation  1. Stand while holding the wand behind you with both hands. Your unaffected arm should be extended above your head with the arm of the affected side extended behind you at the level of your waist. The wand should be pointing straight up and down as you hold it.  2. Slowly pull the wand up behind your back by straightening the elbow of your unaffected arm and bending the elbow of your affected arm.  External rotation  1. Lie on your back with your affected upper arm supported on a small pillow or rolled towel. When you first do this exercise, keep your upper arm close to your body. Over time, bring your arm up to a 90 angle out to the side.  2. Hold the wand across your stomach and with both hands palm up. Your elbow on your affected side should be bent at a 90 angle.  3. Use your unaffected side to help push your forearm away from you and toward the floor. Keep your elbow on your affected side bent at a 90 angle.  Contact a health care provider if you have:   New or increasing pain.   New numbness, tingling, weakness, or discoloration in your arm or hand.  This information is not intended to replace advice given to you by your health care provider. Make sure you discuss any questions you have with your health care provider.  Document Released: 05/23/2003 Document Revised: 10/06/2017 Document Reviewed: 10/06/2017  Elsevier Interactive Patient Education  2019 [employer]

## 2018-11-07 NOTE — Progress Notes (Signed)
Established Patient Office Visit  Subjective:  Patient ID: Raymond Stein, male    DOB: [birth year]  Age: 63 y.o. MRN: [id number]  CC:  Chief Complaint  Patient presents with  . Follow-up    HPI Raymond Stein presents for follow-up of the pneumonia that had been diagnosed at urgent care.  Patient denies cough fever chills or phlegm.  Feels much better.  Left hand dominant but ambidextrous with a 2 to 40-month history of left shoulder pain.  Denies specific injury.  Denies history of recent over shoulder lifting or work.  No prior history of injury.  Patient quit smoking 30 years ago.  History reviewed. No pertinent past medical history.  History reviewed. No pertinent surgical history.  History reviewed. No pertinent family history.  Social History   Socioeconomic History  . Marital status: Married    Spouse name: Not on file  . Number of children: Not on file  . Years of education: Not on file  . Highest education level: Not on file  Occupational History  . Not on file  Social Needs  . Financial resource strain: Not on file  . Food insecurity:    Worry: Not on file    Inability: Not on file  . Transportation needs:    Medical: Not on file    Non-medical: Not on file  Tobacco Use  . Smoking status: Former Games developer  . Smokeless tobacco: Never Used  Substance and Sexual Activity  . Alcohol use: No  . Drug use: Not on file  . Sexual activity: Not on file  Lifestyle  . Physical activity:    Days per week: Not on file    Minutes per session: Not on file  . Stress: Not on file  Relationships  . Social connections:    Talks on phone: Not on file    Gets together: Not on file    Attends religious service: Not on file    Active member of club or organization: Not on file    Attends meetings of clubs or organizations: Not on file    Relationship status: Not on file  . Intimate partner violence:    Fear of current or ex partner: Not on file    Emotionally abused: Not  on file    Physically abused: Not on file    Forced sexual activity: Not on file  Other Topics Concern  . Not on file  Social History Narrative  . Not on file    Outpatient Medications Prior to Visit  Medication Sig Dispense Refill  . sildenafil (REVATIO) 20 MG tablet Take 1-3 tablets 45 minutes prior 50 tablet 0  . benzonatate (TESSALON) 200 MG capsule Take 1 capsule (200 mg total) by mouth 2 (two) times daily as needed for cough. 20 capsule 0   No facility-administered medications prior to visit.     No Known Allergies  ROS Review of Systems  Constitutional: Negative for chills, diaphoresis, fatigue, fever and unexpected weight change.  HENT: Negative for postnasal drip, rhinorrhea, sinus pressure and sinus pain.   Eyes: Negative for photophobia and visual disturbance.  Respiratory: Negative for cough, shortness of breath and wheezing.   Cardiovascular: Negative for chest pain and palpitations.  Gastrointestinal: Negative.   Genitourinary: Negative.   Musculoskeletal: Positive for arthralgias.  Skin: Negative for pallor.  Allergic/Immunologic: Negative for immunocompromised state.  Neurological: Negative for weakness, numbness and headaches.  Hematological: Does not bruise/bleed easily.  Psychiatric/Behavioral: Negative.  Objective:    Physical Exam  Constitutional: He is oriented to person, place, and time. He appears well-developed and well-nourished. No distress.  HENT:  Head: Normocephalic and atraumatic.  Right Ear: External ear normal.  Left Ear: External ear normal.  Mouth/Throat: Oropharynx is clear and moist.  Eyes: Conjunctivae are normal. Right eye exhibits no discharge. Left eye exhibits no discharge. No scleral icterus.  Neck: No JVD present. No tracheal deviation present. No thyromegaly present.  Cardiovascular: Normal rate, regular rhythm and normal heart sounds.  Pulmonary/Chest: Effort normal and breath sounds normal. No stridor. No respiratory  distress. He has no wheezes. He has no rales.  Abdominal: Bowel sounds are normal.  Musculoskeletal:     Left shoulder: He exhibits tenderness and bony tenderness. He exhibits normal range of motion.       Arms:  Lymphadenopathy:    He has no cervical adenopathy.  Neurological: He is alert and oriented to person, place, and time.  Skin: Skin is warm and dry. He is not diaphoretic.  Psychiatric: He has a normal mood and affect. His behavior is normal.    BP 128/78   Pulse 83   Ht 6' (1.829 m)   Wt 160 lb (72.6 kg)   SpO2 98%   BMI 21.70 kg/m  Wt Readings from Last 3 Encounters:  11/07/18 160 lb (72.6 kg)  10/07/18 157 lb 8 oz (71.4 kg)  01/17/18 170 lb 6 oz (77.3 kg)   BP Readings from Last 3 Encounters:  11/07/18 128/78  10/07/18 120/70  01/17/18 126/80   Guideline developer:  UpToDate (see UpToDate for funding source) Date Released: June 2014  Health Maintenance Due  Topic Date Due  . Hepatitis C Screening  1956/05/06  . TETANUS/TDAP  04/27/1977  . COLONOSCOPY  04/27/2008  . INFLUENZA VACCINE  04/07/2018    There are no preventive care reminders to display for this patient.  No results found for: TSH Lab Results  Component Value Date   WBC 3.9 (L) 01/17/2018   HGB 13.8 01/17/2018   HCT 41.7 01/17/2018   MCV 89.0 01/17/2018   PLT 223.0 01/17/2018   Lab Results  Component Value Date   NA 140 01/17/2018   K 4.6 01/17/2018   CO2 32 01/17/2018   GLUCOSE 99 01/17/2018   BUN 12 01/17/2018   CREATININE 1.02 01/17/2018   BILITOT 0.7 01/17/2018   ALKPHOS 45 01/17/2018   AST 13 01/17/2018   ALT 11 01/17/2018   PROT 6.2 01/17/2018   ALBUMIN 3.9 01/17/2018   CALCIUM 9.0 01/17/2018   GFR 95.90 01/17/2018   Lab Results  Component Value Date   CHOL 141 01/17/2018   Lab Results  Component Value Date   HDL 55.00 01/17/2018   Lab Results  Component Value Date   LDLCALC 77 01/17/2018   Lab Results  Component Value Date   TRIG 49.0 01/17/2018   Lab  Results  Component Value Date   CHOLHDL 3 01/17/2018   No results found for: HGBA1C    Assessment & Plan:   Problem List Items Addressed This Visit      Other   History of pneumonia - Primary   Relevant Orders   DG Chest 2 View   Pneumococcal polysaccharide vaccine 23-valent greater than or equal to 2yo subcutaneous/IM   Acute pain of left shoulder   Relevant Medications   meloxicam (MOBIC) 15 MG tablet   Other Relevant Orders   DG Shoulder Left      Meds  ordered this encounter  Medications  . meloxicam (MOBIC) 15 MG tablet    Sig: Take 1 tablet (15 mg total) by mouth daily.    Dispense:  30 tablet    Refill:  0    Follow-up: No follow-ups on file.   Patient was given range of motion exercises to do for his shoulder.  He will use meloxicam.  Follow-up in 1 month or so for injection if shoulder is not improving.

## 2018-12-04 ENCOUNTER — Other Ambulatory Visit: Payer: Self-pay | Admitting: Family Medicine

## 2018-12-04 DIAGNOSIS — M25512 Pain in left shoulder: Secondary | ICD-10-CM

## 2019-01-01 ENCOUNTER — Other Ambulatory Visit: Payer: Self-pay | Admitting: Family Medicine

## 2019-01-01 DIAGNOSIS — M25512 Pain in left shoulder: Secondary | ICD-10-CM

## 2019-01-09 ENCOUNTER — Ambulatory Visit: Payer: 59 | Admitting: Family Medicine

## 2019-03-30 ENCOUNTER — Other Ambulatory Visit: Payer: Self-pay | Admitting: Family Medicine

## 2019-03-30 DIAGNOSIS — M25512 Pain in left shoulder: Secondary | ICD-10-CM

## 2019-07-03 ENCOUNTER — Other Ambulatory Visit: Payer: Self-pay | Admitting: Family Medicine

## 2019-07-03 DIAGNOSIS — M25512 Pain in left shoulder: Secondary | ICD-10-CM

## 2019-10-15 ENCOUNTER — Other Ambulatory Visit: Payer: Self-pay | Admitting: Family Medicine

## 2019-10-15 DIAGNOSIS — M25512 Pain in left shoulder: Secondary | ICD-10-CM

## 2019-12-07 ENCOUNTER — Other Ambulatory Visit: Payer: Self-pay

## 2019-12-07 DIAGNOSIS — M25512 Pain in left shoulder: Secondary | ICD-10-CM

## 2019-12-07 MED ORDER — MELOXICAM 15 MG PO TABS: 15.0000 mg | ORAL_TABLET | Freq: Every day | ORAL | 0 refills | Status: DC

## 2019-12-07 NOTE — Telephone Encounter (Signed)
Last OV 11/07/18 Last fill 07/04/19  #90/0 Pt need a office visit before any other refills.

## 2020-01-03 ENCOUNTER — Other Ambulatory Visit: Payer: Self-pay | Admitting: Family Medicine

## 2020-01-03 DIAGNOSIS — M25512 Pain in left shoulder: Secondary | ICD-10-CM

## 2020-01-11 NOTE — Telephone Encounter (Signed)
Admin-Can ya'll please schedule pt for an overdue F/U with Dr. Kirtland Bouchard? Thx dmf

## 2020-01-11 NOTE — Telephone Encounter (Signed)
Spoke to patients wife, informed her that [name] would need to be seen before his meds arerefilled. She will have him call us to schedule.

## 2020-02-01 ENCOUNTER — Other Ambulatory Visit: Payer: Self-pay | Admitting: Family Medicine

## 2020-02-01 DIAGNOSIS — M25512 Pain in left shoulder: Secondary | ICD-10-CM

## 2020-02-06 HISTORY — PX: BACK SURGERY: SHX140

## 2020-02-18 ENCOUNTER — Ambulatory Visit (HOSPITAL_COMMUNITY)
Admission: EM | Admit: 2020-02-18 | Discharge: 2020-02-18 | Disposition: A | Discharge status: Home / Self Care | Payer: No Typology Code available for payment source | Attending: Emergency Medicine | Admitting: Emergency Medicine

## 2020-02-18 ENCOUNTER — Encounter (HOSPITAL_COMMUNITY): Payer: Self-pay | Admitting: Emergency Medicine

## 2020-02-18 ENCOUNTER — Other Ambulatory Visit: Payer: Self-pay

## 2020-02-18 DIAGNOSIS — S39012A Strain of muscle, fascia and tendon of lower back, initial encounter: Secondary | ICD-10-CM

## 2020-02-18 DIAGNOSIS — Y939 Activity, unspecified: Secondary | ICD-10-CM

## 2020-02-18 MED ORDER — PREDNISONE 10 MG PO TABS: ORAL_TABLET | ORAL | 0 refills | Status: DC

## 2020-02-18 MED ORDER — KETOROLAC TROMETHAMINE 60 MG/2ML IM SOLN
INTRAMUSCULAR | Status: AC
  Filled 2020-02-18: qty 2

## 2020-02-18 MED ORDER — CYCLOBENZAPRINE HCL 5 MG PO TABS: 5.0000 mg | ORAL_TABLET | Freq: Two times a day (BID) | ORAL | 0 refills | Status: DC | PRN

## 2020-02-18 MED ORDER — KETOROLAC TROMETHAMINE 60 MG/2ML IM SOLN
60.0000 mg | Freq: Once | INTRAMUSCULAR | Status: AC
  Administered 2020-02-18: 60 mg via INTRAMUSCULAR

## 2020-02-18 NOTE — Discharge Instructions (Signed)
Begin prednisone taper in the morning, begin with 6 tabs day 1, decrease by 1 tab each day until complete- take with food in the morning if able You may use flexeril as needed to help with pain. This is a muscle relaxer and causes sedation- please use only at bedtime or when you will be home and not have to drive/work Gentle stretching- see attached  Follow up if not improving or worsening

## 2020-02-18 NOTE — ED Provider Notes (Signed)
Raymond Stein    CSN: [id number] Arrival date & time: 02/18/20  [place]      History   Chief Complaint Chief Complaint  Patient presents with  . Flank Pain    HPI Raymond Stein is a 65 y.o. male presenting today for evaluation of right-sided flank/back pain.  Patient reports that over the past few weeks he has had pain in his right lower side.  Pain worsens with movement.  He denies any injury or fall.  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  He denies any associated abdominal pain nausea vomiting or diarrhea.  Denies any urinary symptoms of dysuria, increased frequency, urgency, hematuria.  Denies prior kidney stones.  Denies any testicular pain or groin pain.  He has taken meloxicam occasionally for his pain.  Denies radiation into extremities.  Denies numbness or tingling.  Denies loss of control of bowel/bladder.  HPI  History reviewed. No pertinent past medical history.  Patient Active Problem List   Diagnosis Date Noted  . Acute pain of left shoulder 11/07/2018  . History of hematuria 10/07/2018  . History of pneumonia 10/07/2018  . Encounter for health maintenance examination with abnormal findings 01/17/2018  . 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 No pertinent surgical history.     Home Medications    Prior to Admission medications   Medication Sig Start Date End Date Taking? Authorizing Provider  meloxicam (MOBIC) 15 MG tablet TAKE 1 TABLET BY MOUTH EVERY DAY 02/01/20  Yes Libby Maw, MD  cyclobenzaprine (FLEXERIL) 5 MG tablet Take 1-2 tablets (5-10 mg total) by mouth 2 (two) times daily as needed for muscle spasms. 02/18/20   Merl Bommarito C, PA-C  predniSONE (DELTASONE) 10 MG tablet Begin with 6 tabs on day 1, 5 tab on day 2, 4 tab on day 3, 3 tab on day 4, 2 tab on day 5, 1 tab on day 6-take with food 02/18/20   Maryjean Corpening, [place] C, PA-C  sildenafil  (REVATIO) 20 MG tablet Take 1-3 tablets 45 minutes prior 01/18/18   Libby Maw, MD    Family History Family History  Problem Relation Age of Onset  . Cancer Mother   . Heart failure Father     Social History Social History   Tobacco Use  . Smoking status: Former Research scientist (life sciences)  . Smokeless tobacco: Never Used  Substance Use Topics  . Alcohol use: No  . Drug use: Not on file     Allergies   Patient has no known allergies.   Review of Systems Review of Systems  Constitutional: Negative for fatigue and fever.  Eyes: Negative for redness, itching and visual disturbance.  Respiratory: Negative for shortness of breath.   Cardiovascular: Negative for chest pain and leg swelling.  Gastrointestinal: Negative for nausea and vomiting.  Genitourinary: Positive for flank pain. Negative for difficulty urinating, dysuria, hematuria and testicular pain.  Musculoskeletal: Positive for back pain and myalgias. Negative for arthralgias.  Skin: Negative for color change, rash and wound.  Neurological: Negative for dizziness, syncope, weakness, light-headedness and headaches.     Physical Exam Triage Vital Signs ED Triage Vitals  Enc Vitals Group     BP 02/18/20 1520 (!) 154/98     Pulse Rate 02/18/20 1520 75     Resp 02/18/20 1520 14     Temp 02/18/20 1520 97.7 F (36.5 C)     Temp Source 02/18/20  1520 Oral     SpO2 02/18/20 1520 99 %     Weight --      Height --      Head Circumference --      Peak Flow --      Pain Score 02/18/20 1517 10     Pain Loc --      Pain Edu? --      Excl. in GC? --    No data found.  Updated Vital Signs BP (!) 154/98 (BP Location: Right Arm)   Pulse 75   Temp 97.7 F (36.5 C) (Oral)   Resp 14   SpO2 99%   Visual Acuity Right Eye Distance:   Left Eye Distance:   Bilateral Distance:    Right Eye Near:   Left Eye Near:    Bilateral Near:     Physical Exam Vitals and nursing note reviewed.  Constitutional:      Appearance: He is  well-developed.     Comments: No acute distress  HENT:     Head: Normocephalic and atraumatic.     Nose: Nose normal.  Eyes:     Conjunctiva/sclera: Conjunctivae normal.  Cardiovascular:     Rate and Rhythm: Normal rate.  Pulmonary:     Effort: Pulmonary effort is normal. No respiratory distress.     Comments: Breathing comfortably at rest, CTABL, no wheezing, rales or other adventitious sounds auscultated  Abdominal:     General: There is no distension.     Comments: Soft, nondistended, nontender to light and deep palpation throughout abdomen  Musculoskeletal:        General: Normal range of motion.     Cervical back: Neck supple.     Comments: Patient ambulates from chair to exam table without abnormality and independently  Back: Nontender to palpation of thoracic and lumbar spine midline, mild tenderness to palpation to sacral area midline, increased tenderness throughout lumbar musculature on right side extending towards flank  Strength at hips and knees 5/5 and equal bilaterally, patellar reflex 2+ bilaterally  Skin:    General: Skin is warm and dry.  Neurological:     Mental Status: He is alert and oriented to person, place, and time.      UC Treatments / Results  Labs (all labs ordered are listed, but only abnormal results are displayed) Labs Reviewed - No data to display  EKG   Radiology No results found.  Procedures Procedures (including critical care time)  Medications Ordered in UC Medications  ketorolac (TORADOL) injection 60 mg (60 mg Intramuscular Given 02/18/20 1558)    Initial Impression / Assessment and Plan / UC Course  I have reviewed the triage vital signs and the nursing notes.  Pertinent labs & imaging results that were available during my care of the patient were reviewed by me and considered in my medical decision making (see chart for details).     No GU symptoms, no GI symptoms, worse with movement and hip flexion, suspect most likely  muscular etiology.  Recommending symptomatic and supportive care with anti-inflammatories.  Has been using NSAIDs without relief, will do trial of prednisone taper x6 days along with muscle relaxers.  Provided a few days off from work for rest, avoid heavy lifting, but avoid bedrest.  Gentle stretching.  Provided Toradol prior to discharge.  Discussed strict return precautions. Patient verbalized understanding and is agreeable with plan.  Final Clinical Impressions(s) / UC Diagnoses   Final diagnoses:  Strain of lumbar region, initial  encounter     Discharge Instructions     Begin prednisone taper in the morning, begin with 6 tabs day 1, decrease by 1 tab each day until complete- take with food in the morning if able You may use flexeril as needed to help with pain. This is a muscle relaxer and causes sedation- please use only at bedtime or when you will be home and not have to drive/work Gentle stretching- see attached  Follow up if not improving or worsening   ED Prescriptions    Medication Sig Dispense Auth. Provider   predniSONE (DELTASONE) 10 MG tablet Begin with 6 tabs on day 1, 5 tab on day 2, 4 tab on day 3, 3 tab on day 4, 2 tab on day 5, 1 tab on day 6-take with food 21 tablet Mackinley Cassaday C, PA-C   cyclobenzaprine (FLEXERIL) 5 MG tablet Take 1-2 tablets (5-10 mg total) by mouth 2 (two) times daily as needed for muscle spasms. 24 tablet Lynleigh Kovack, [place] C, PA-C     PDMP not reviewed this encounter.   Lew Dawes, [place] 02/18/20 2126

## 2020-02-18 NOTE — ED Triage Notes (Signed)
Pt right sided flank pain "for a while now" . Pt denies n/v/d. Pt states denies any urinary changes. Pt states he does heavy lifting at work. Pain is worse with ROM.

## 2020-02-29 ENCOUNTER — Other Ambulatory Visit: Payer: Self-pay | Admitting: Family Medicine

## 2020-02-29 DIAGNOSIS — M25512 Pain in left shoulder: Secondary | ICD-10-CM

## 2020-04-29 ENCOUNTER — Ambulatory Visit (INDEPENDENT_AMBULATORY_CARE_PROVIDER_SITE_OTHER): Payer: No Typology Code available for payment source

## 2020-04-29 ENCOUNTER — Ambulatory Visit (INDEPENDENT_AMBULATORY_CARE_PROVIDER_SITE_OTHER): Payer: No Typology Code available for payment source | Admitting: Family Medicine

## 2020-04-29 ENCOUNTER — Encounter: Payer: Self-pay | Admitting: Family Medicine

## 2020-04-29 ENCOUNTER — Other Ambulatory Visit: Payer: Self-pay

## 2020-04-29 VITALS — BP 140/78 | HR 86 | Temp 98.1°F | Ht 72.0 in | Wt 170.8 lb

## 2020-04-29 DIAGNOSIS — M545 Low back pain, unspecified: Secondary | ICD-10-CM

## 2020-04-29 DIAGNOSIS — G8929 Other chronic pain: Secondary | ICD-10-CM | POA: Insufficient documentation

## 2020-04-29 DIAGNOSIS — Z0000 Encounter for general adult medical examination without abnormal findings: Secondary | ICD-10-CM

## 2020-04-29 NOTE — Addendum Note (Signed)
Addended by: Varney Biles on: 04/29/2020 03:48 PM   Modules accepted: Orders

## 2020-04-29 NOTE — Patient Instructions (Signed)
Health Maintenance, Male Adopting a healthy lifestyle and getting preventive care are important in promoting health and wellness. Ask your health care provider about:  The right schedule for you to have regular tests and exams.  Things you can do on your own to prevent diseases and keep yourself healthy. What should I know about diet, weight, and exercise? Eat a healthy diet   Eat a diet that includes plenty of vegetables, fruits, low-fat dairy products, and lean protein.  Do not eat a lot of foods that are high in solid fats, added sugars, or sodium. Maintain a healthy weight Body mass index (BMI) is a measurement that can be used to identify possible weight problems. It estimates body fat based on height and weight. Your health care provider can help determine your BMI and help you achieve or maintain a healthy weight. Get regular exercise Get regular exercise. This is one of the most important things you can do for your health. Most adults should:  Exercise for at least 150 minutes each week. The exercise should increase your heart rate and make you sweat (moderate-intensity exercise).  Do strengthening exercises at least twice a week. This is in addition to the moderate-intensity exercise.  Spend less time sitting. Even light physical activity can be beneficial. Watch cholesterol and blood lipids Have your blood tested for lipids and cholesterol at 62 years of age, then have this test every 5 years. You may need to have your cholesterol levels checked more often if:  Your lipid or cholesterol levels are high.  You are older than 62 years of age.  You are at high risk for heart disease. What should I know about cancer screening? Many types of cancers can be detected early and may often be prevented. Depending on your health history and family history, you may need to have cancer screening at various ages. This may include screening for:  Colorectal cancer.  Prostate  cancer.  Skin cancer.  Lung cancer. What should I know about heart disease, diabetes, and high blood pressure? Blood pressure and heart disease  High blood pressure causes heart disease and increases the risk of stroke. This is more likely to develop in people who have high blood pressure readings, are of African descent, or are overweight.  Talk with your health care provider about your target blood pressure readings.  Have your blood pressure checked: ? Every 3-5 years if you are 18-39 years of age. ? Every year if you are 40 years old or older.  If you are between the ages of 65 and 75 and are a current or former smoker, ask your health care provider if you should have a one-time screening for abdominal aortic aneurysm (AAA). Diabetes Have regular diabetes screenings. This checks your fasting blood sugar level. Have the screening done:  Once every three years after age 45 if you are at a normal weight and have a low risk for diabetes.  More often and at a younger age if you are overweight or have a high risk for diabetes. What should I know about preventing infection? Hepatitis B If you have a higher risk for hepatitis B, you should be screened for this virus. Talk with your health care provider to find out if you are at risk for hepatitis B infection. Hepatitis C Blood testing is recommended for:  Everyone born from 1945 through 1965.  Anyone with known risk factors for hepatitis C. Sexually transmitted infections (STIs)  You should be screened each year   for STIs, including gonorrhea and chlamydia, if: ? You are sexually active and are younger than 62 years of age. ? You are older than 62 years of age and your health care provider tells you that you are at risk for this type of infection. ? Your sexual activity has changed since you were last screened, and you are at increased risk for chlamydia or gonorrhea. Ask your health care provider if you are at risk.  Ask your  health care provider about whether you are at high risk for HIV. Your health care provider may recommend a prescription medicine to help prevent HIV infection. If you choose to take medicine to prevent HIV, you should first get tested for HIV. You should then be tested every 3 months for as long as you are taking the medicine. Follow these instructions at home: Lifestyle  Do not use any products that contain nicotine or tobacco, such as cigarettes, e-cigarettes, and chewing tobacco. If you need help quitting, ask your health care provider.  Do not use street drugs.  Do not share needles.  Ask your health care provider for help if you need support or information about quitting drugs. Alcohol use  Do not drink alcohol if your health care provider tells you not to drink.  If you drink alcohol: ? Limit how much you have to 0-2 drinks a day. ? Be aware of how much alcohol is in your drink. In the [place] one drink equals one 12 oz bottle of beer (355 mL), one 5 oz glass of wine (148 mL), or one 1 oz glass of hard liquor (44 mL). General instructions  Schedule regular health, dental, and eye exams.  Stay current with your vaccines.  Tell your health care provider if: ? You often feel depressed. ? You have ever been abused or do not feel safe at home. Summary  Adopting a healthy lifestyle and getting preventive care are important in promoting health and wellness.  Follow your health care provider's instructions about healthy diet, exercising, and getting tested or screened for diseases.  Follow your health care provider's instructions on monitoring your cholesterol and blood pressure. This information is not intended to replace advice given to you by your health care provider. Make sure you discuss any questions you have with your health care provider. Document Revised: 08/17/2018 Document Reviewed: 08/17/2018 Elsevier Patient Education  2020 [employer]  Preventive Care 40-64 Years  Old, Male Preventive care refers to lifestyle choices and visits with your health care provider that can promote health and wellness. This includes:  A yearly physical exam. This is also called an annual well check.  Regular dental and eye exams.  Immunizations.  Screening for certain conditions.  Healthy lifestyle choices, such as eating a healthy diet, getting regular exercise, not using drugs or products that contain nicotine and tobacco, and limiting alcohol use. What can I expect for my preventive care visit? Physical exam Your health care provider will check:  Height and weight. These may be used to calculate body mass index (BMI), which is a measurement that tells if you are at a healthy weight.  Heart rate and blood pressure.  Your skin for abnormal spots. Counseling Your health care provider may ask you questions about:  Alcohol, tobacco, and drug use.  Emotional well-being.  Home and relationship well-being.  Sexual activity.  Eating habits.  Work and work environment. What immunizations do I need?  Influenza (flu) vaccine  This is recommended every year. Tetanus, diphtheria,   and pertussis (Tdap) vaccine  You may need a Td booster every 10 years. Varicella (chickenpox) vaccine  You may need this vaccine if you have not already been vaccinated. Zoster (shingles) vaccine  You may need this after age 63. Measles, mumps, and rubella (MMR) vaccine  You may need at least one dose of MMR if you were born in 1957 or later. You may also need a second dose. Pneumococcal conjugate (PCV13) vaccine  You may need this if you have certain conditions and were not previously vaccinated. Pneumococcal polysaccharide (PPSV23) vaccine  You may need one or two doses if you smoke cigarettes or if you have certain conditions. Meningococcal conjugate (MenACWY) vaccine  You may need this if you have certain conditions. Hepatitis A vaccine  You may need this if you have  certain conditions or if you travel or work in places where you may be exposed to hepatitis A. Hepatitis B vaccine  You may need this if you have certain conditions or if you travel or work in places where you may be exposed to hepatitis B. Haemophilus influenzae type b (Hib) vaccine  You may need this if you have certain risk factors. Human papillomavirus (HPV) vaccine  If recommended by your health care provider, you may need three doses over 6 months. You may receive vaccines as individual doses or as more than one vaccine together in one shot (combination vaccines). Talk with your health care provider about the risks and benefits of combination vaccines. What tests do I need? Blood tests  Lipid and cholesterol levels. These may be checked every 5 years, or more frequently if you are over 68 years old.  Hepatitis C test.  Hepatitis B test. Screening  Lung cancer screening. You may have this screening every year starting at age 78 if you have a 30-pack-year history of smoking and currently smoke or have quit within the past 15 years.  Prostate cancer screening. Recommendations will vary depending on your family history and other risks.  Colorectal cancer screening. All adults should have this screening starting at age 38 and continuing until age 22. Your health care provider may recommend screening at age 73 if you are at increased risk. You will have tests every 1-10 years, depending on your results and the type of screening test.  Diabetes screening. This is done by checking your blood sugar (glucose) after you have not eaten for a while (fasting). You may have this done every 1-3 years.  Sexually transmitted disease (STD) testing. Follow these instructions at home: Eating and drinking  Eat a diet that includes fresh fruits and vegetables, whole grains, lean protein, and low-fat dairy products.  Take vitamin and mineral supplements as recommended by your health care  provider.  Do not drink alcohol if your health care provider tells you not to drink.  If you drink alcohol: ? Limit how much you have to 0-2 drinks a day. ? Be aware of how much alcohol is in your drink. In the [place] one drink equals one 12 oz bottle of beer (355 mL), one 5 oz glass of wine (148 mL), or one 1 oz glass of hard liquor (44 mL). Lifestyle  Take daily care of your teeth and gums.  Stay active. Exercise for at least 30 minutes on 5 or more days each week.  Do not use any products that contain nicotine or tobacco, such as cigarettes, e-cigarettes, and chewing tobacco. If you need help quitting, ask your health care provider.  If  you are sexually active, practice safe sex. Use a condom or other form of protection to prevent STIs (sexually transmitted infections).  Talk with your health care provider about taking a low-dose aspirin every day starting at age 70. What's next?  Go to your health care provider once a year for a well check visit.  Ask your health care provider how often you should have your eyes and teeth checked.  Stay up to date on all vaccines. This information is not intended to replace advice given to you by your health care provider. Make sure you discuss any questions you have with your health care provider. Document Revised: 08/18/2018 Document Reviewed: 08/18/2018 Elsevier Patient Education  2020 [employer] Inc.  Chronic Back Pain When back pain lasts longer than 3 months, it is called chronic back pain. Pain may get worse at certain times (flare-ups). There are things you can do at home to manage your pain. Follow these instructions at home: Activity      Avoid bending and other activities that make pain worse.  When standing: ? Keep your upper back and neck straight. ? Keep your shoulders pulled back. ? Avoid slouching.  When sitting: ? Keep your back straight. ? Relax your shoulders. Do not round your shoulders or pull them backward.  Do  not sit or stand in one place for long periods of time.  Take short rest breaks during the day. Lying down or standing is usually better than sitting. Resting can help relieve pain.  When sitting or lying down for a long time, do some mild activity or stretching. This will help to prevent stiffness and pain.  Get regular exercise. Ask your doctor what activities are safe for you.  Do not lift anything that is heavier than 10 lb (4.5 kg). To prevent injury when you lift things: ? Bend your knees. ? Keep the weight close to your body. ? Avoid twisting. Managing pain  If told, put ice on the painful area. Your doctor may tell you to use ice for 24-48 hours after a flare-up starts. ? Put ice in a plastic bag. ? Place a towel between your skin and the bag. ? Leave the ice on for 20 minutes, 2-3 times a day.  If told, put heat on the painful area as often as told by your doctor. Use the heat source that your doctor recommends, such as a moist heat pack or a heating pad. ? Place a towel between your skin and the heat source. ? Leave the heat on for 20-30 minutes. ? Remove the heat if your skin turns bright red. This is especially important if you are unable to feel pain, heat, or cold. You may have a greater risk of getting burned.  Soak in a warm bath. This can help relieve pain.  Take over-the-counter and prescription medicines only as told by your doctor. General instructions  Sleep on a firm mattress. Try lying on your side with your knees slightly bent. If you lie on your back, put a pillow under your knees.  Keep all follow-up visits as told by your doctor. This is important. Contact a doctor if:  You have pain that does not get better with rest or medicine. Get help right away if:  One or both of your arms or legs feel weak.  One or both of your arms or legs lose feeling (numbness).  You have trouble controlling when you poop (bowel movement) or pee (urinate).  You feel  sick  to your stomach (nauseous).  You throw up (vomit).  You have belly (abdominal) pain.  You have shortness of breath.  You pass out (faint). Summary  When back pain lasts longer than 3 months, it is called chronic back pain.  Pain may get worse at certain times (flare-ups).  Use ice and heat as told by your doctor. Your doctor may tell you to use ice after flare-ups. This information is not intended to replace advice given to you by your health care provider. Make sure you discuss any questions you have with your health care provider. Document Revised: 12/15/2018 Document Reviewed: 04/08/2017 Elsevier Patient Education  2020 [employer].

## 2020-04-29 NOTE — Progress Notes (Signed)
Established Patient Office Visit  Subjective:  Patient ID: Raymond Stein, male    DOB: November 12, 1955  Age: 64 y.o. MRN: [id number]  CC:  Chief Complaint  Patient presents with  . Annual Exam    CPE, patient would like prostate checked.     HPI Raymond Stein presents for complete physical.  He has been doing well.  No specific problems other than occasional lower back pain that is mostly on the right.  It is nonradiating.  There is no change in bowel or bladder function.  Denies saddle paresthesias.  Meloxicam relieves.  Has been there for years.  No past medical history on file.  No past surgical history on file.  Family History  Problem Relation Age of Onset  . Cancer Mother   . Heart failure Father     Social History   Socioeconomic History  . Marital status: Married    Spouse name: Not on file  . Number of children: Not on file  . Years of education: Not on file  . Highest education level: Not on file  Occupational History  . Not on file  Tobacco Use  . Smoking status: Former Smoker    Years: 30.00    Types: Cigarettes    Quit date: 03/06/1989    Years since quitting: 31.1  . Smokeless tobacco: Never Used  Vaping Use  . Vaping Use: Never used  Substance and Sexual Activity  . Alcohol use: No  . Drug use: Never  . Sexual activity: Yes  Other Topics Concern  . Not on file  Social History Narrative  . Not on file   Social Determinants of Health   Financial Resource Strain:   . Difficulty of Paying Living Expenses: Not on file  Food Insecurity:   . Worried About Programme researcher, broadcasting/film/video in the Last Year: Not on file  . Ran Out of Food in the Last Year: Not on file  Transportation Needs:   . Lack of Transportation (Medical): Not on file  . Lack of Transportation (Non-Medical): Not on file  Physical Activity:   . Days of Exercise per Week: Not on file  . Minutes of Exercise per Session: Not on file  Stress:   . Feeling of Stress : Not on file  Social  Connections:   . Frequency of Communication with Friends and Family: Not on file  . Frequency of Social Gatherings with Friends and Family: Not on file  . Attends Religious Services: Not on file  . Active Member of Clubs or Organizations: Not on file  . Attends Banker Meetings: Not on file  . Marital Status: Not on file  Intimate Partner Violence:   . Fear of Current or Ex-Partner: Not on file  . Emotionally Abused: Not on file  . Physically Abused: Not on file  . Sexually Abused: Not on file    Outpatient Medications Prior to Visit  Medication Sig Dispense Refill  . meloxicam (MOBIC) 15 MG tablet TAKE 1 TABLET BY MOUTH EVERY DAY 30 tablet 0  . cyclobenzaprine (FLEXERIL) 5 MG tablet Take 1-2 tablets (5-10 mg total) by mouth 2 (two) times daily as needed for muscle spasms. (Patient not taking: Reported on 04/29/2020) 24 tablet 0  . predniSONE (DELTASONE) 10 MG tablet Begin with 6 tabs on day 1, 5 tab on day 2, 4 tab on day 3, 3 tab on day 4, 2 tab on day 5, 1 tab on day 6-take with food (Patient  not taking: Reported on 04/29/2020) 21 tablet 0  . sildenafil (REVATIO) 20 MG tablet Take 1-3 tablets 45 minutes prior (Patient not taking: Reported on 04/29/2020) 50 tablet 0   No facility-administered medications prior to visit.    No Known Allergies  ROS Review of Systems  Constitutional: Negative.   HENT: Negative.   Eyes: Negative for photophobia and visual disturbance.  Respiratory: Negative.   Cardiovascular: Negative.   Gastrointestinal: Negative.   Endocrine: Negative for polyphagia and polyuria.  Genitourinary: Negative for decreased urine volume, difficulty urinating, flank pain, frequency, hematuria and urgency.  Musculoskeletal: Positive for back pain. Negative for myalgias.  Skin: Negative for pallor and rash.  Allergic/Immunologic: Negative for immunocompromised state.  Neurological: Negative for weakness and numbness.  Hematological: Does not bruise/bleed  easily.  Psychiatric/Behavioral: Negative.       Objective:    Physical Exam Vitals and nursing note reviewed.  Constitutional:      General: He is not in acute distress.    Appearance: Normal appearance. He is normal weight. He is not ill-appearing, toxic-appearing or diaphoretic.  HENT:     Head: Normocephalic and atraumatic.     Right Ear: Tympanic membrane, ear canal and external ear normal.     Left Ear: Tympanic membrane, ear canal and external ear normal.     Mouth/Throat:     Mouth: Mucous membranes are moist.     Pharynx: Oropharynx is clear. No oropharyngeal exudate or posterior oropharyngeal erythema.  Eyes:     General: No scleral icterus.       Right eye: No discharge.        Left eye: No discharge.     Extraocular Movements: Extraocular movements intact.     Conjunctiva/sclera: Conjunctivae normal.     Pupils: Pupils are equal, round, and reactive to light.  Cardiovascular:     Rate and Rhythm: Normal rate and regular rhythm.  Pulmonary:     Effort: Pulmonary effort is normal.     Breath sounds: Normal breath sounds.  Abdominal:     General: Bowel sounds are normal. There is no distension.     Palpations: There is no mass.     Tenderness: There is no abdominal tenderness. There is no guarding or rebound.     Hernia: No hernia is present. There is no hernia in the left inguinal area or right inguinal area.  Genitourinary:    Penis: Circumcised. No hypospadias, erythema, tenderness, discharge, swelling or lesions.      Testes:        Right: Mass, tenderness or swelling not present. Right testis is descended.        Left: Mass, tenderness or swelling not present. Left testis is descended.     Epididymis:     Right: Not inflamed or enlarged.     Left: Not inflamed or enlarged.     Prostate: Enlarged. Not tender and no nodules present.     Rectum: Guaiac result negative. No mass, tenderness, anal fissure, external hemorrhoid or internal hemorrhoid. Normal anal  tone.  Musculoskeletal:     Cervical back: No rigidity or tenderness.  Lymphadenopathy:     Cervical: No cervical adenopathy.     Lower Body: No right inguinal adenopathy. No left inguinal adenopathy.  Skin:    General: Skin is warm and dry.  Neurological:     Mental Status: He is alert and oriented to person, place, and time.  Psychiatric:        Mood and Affect:  Mood normal.        Behavior: Behavior normal.     BP 140/78   Pulse 86   Temp 98.1 F (36.7 C) (Tympanic)   Ht 6' (1.829 m)   Wt 170 lb 12.8 oz (77.5 kg)   SpO2 99%   BMI 23.16 kg/m  Wt Readings from Last 3 Encounters:  04/29/20 170 lb 12.8 oz (77.5 kg)  11/07/18 160 lb (72.6 kg)  10/07/18 157 lb 8 oz (71.4 kg)     Health Maintenance Due  Topic Date Due  . Hepatitis C Screening  Never done  . COLONOSCOPY  Never done  . INFLUENZA VACCINE  04/07/2020    There are no preventive care reminders to display for this patient.  No results found for: TSH Lab Results  Component Value Date   WBC 3.9 (L) 01/17/2018   HGB 13.8 01/17/2018   HCT 41.7 01/17/2018   MCV 89.0 01/17/2018   PLT 223.0 01/17/2018   Lab Results  Component Value Date   NA 140 01/17/2018   K 4.6 01/17/2018   CO2 32 01/17/2018   GLUCOSE 99 01/17/2018   BUN 12 01/17/2018   CREATININE 1.02 01/17/2018   BILITOT 0.7 01/17/2018   ALKPHOS 45 01/17/2018   AST 13 01/17/2018   ALT 11 01/17/2018   PROT 6.2 01/17/2018   ALBUMIN 3.9 01/17/2018   CALCIUM 9.0 01/17/2018   GFR 95.90 01/17/2018   Lab Results  Component Value Date   CHOL 141 01/17/2018   Lab Results  Component Value Date   HDL 55.00 01/17/2018   Lab Results  Component Value Date   LDLCALC 77 01/17/2018   Lab Results  Component Value Date   TRIG 49.0 01/17/2018   Lab Results  Component Value Date   CHOLHDL 3 01/17/2018   No results found for: HGBA1C    Assessment & Plan:   Problem List Items Addressed This Visit      Other   Healthcare maintenance - Primary    Relevant Orders   CBC   Comprehensive metabolic panel   Lipid panel   Urinalysis, Routine w reflex microscopic   PSA   Right-sided low back pain without sciatica   Relevant Orders   DG Lumbar Spine Complete      No orders of the defined types were placed in this encounter.   Follow-up: Return in about 1 year (around 04/29/2021), or if symptoms worsen or fail to improve.   Return fasting for above ordered blood work.  Demonstrated proper lifting technique.  Information given on health maintenance and disease prevention.  Mliss Sax, MD

## 2020-05-01 ENCOUNTER — Other Ambulatory Visit (INDEPENDENT_AMBULATORY_CARE_PROVIDER_SITE_OTHER): Payer: No Typology Code available for payment source

## 2020-05-01 ENCOUNTER — Other Ambulatory Visit: Payer: Self-pay | Admitting: Family Medicine

## 2020-05-01 DIAGNOSIS — Z0000 Encounter for general adult medical examination without abnormal findings: Secondary | ICD-10-CM

## 2020-05-01 DIAGNOSIS — M25512 Pain in left shoulder: Secondary | ICD-10-CM

## 2020-05-01 LAB — URINALYSIS, ROUTINE W REFLEX MICROSCOPIC
Bilirubin Urine: NEGATIVE
Hgb urine dipstick: NEGATIVE
Ketones, ur: NEGATIVE
Leukocytes,Ua: NEGATIVE
Nitrite: NEGATIVE
RBC / HPF: NONE SEEN (ref 0–?)
Specific Gravity, Urine: 1.015 (ref 1.000–1.030)
Total Protein, Urine: NEGATIVE
Urine Glucose: NEGATIVE
Urobilinogen, UA: 0.2 (ref 0.0–1.0)
WBC, UA: NONE SEEN (ref 0–?)
pH: 6 (ref 5.0–8.0)

## 2020-05-01 LAB — COMPREHENSIVE METABOLIC PANEL
ALT: 9 U/L (ref 0–53)
AST: 13 U/L (ref 0–37)
Albumin: 4.3 g/dL (ref 3.5–5.2)
Alkaline Phosphatase: 51 U/L (ref 39–117)
BUN: 8 mg/dL (ref 6–23)
CO2: 27 mEq/L (ref 19–32)
Calcium: 9.3 mg/dL (ref 8.4–10.5)
Chloride: 104 mEq/L (ref 96–112)
Creatinine, Ser: 1.11 mg/dL (ref 0.40–1.50)
GFR: 81.22 mL/min (ref >60.00)
Glucose, Bld: 101 mg/dL — ABNORMAL HIGH (ref 70–99)
Potassium: 4.1 mEq/L (ref 3.5–5.1)
Sodium: 137 mEq/L (ref 135–145)
Total Bilirubin: 0.8 mg/dL (ref 0.2–1.2)
Total Protein: 6.6 g/dL (ref 6.0–8.3)

## 2020-05-01 LAB — CBC
HCT: 43.3 % (ref 39.0–52.0)
Hemoglobin: 14.6 g/dL (ref 13.0–17.0)
MCHC: 33.7 g/dL (ref 30.0–36.0)
MCV: 88.3 fl (ref 78.0–100.0)
Platelets: 248 10*3/uL (ref 150.0–400.0)
RBC: 4.91 Mil/uL (ref 4.22–5.81)
RDW: 13.5 % (ref 11.5–15.5)
WBC: 5.5 10*3/uL (ref 4.0–10.5)

## 2020-05-01 LAB — LIPID PANEL
Cholesterol: 163 mg/dL (ref 0–200)
HDL: 62.4 mg/dL (ref >39.00)
LDL Cholesterol: 91 mg/dL (ref 0–99)
NonHDL: 101.07
Total CHOL/HDL Ratio: 3
Triglycerides: 52 mg/dL (ref 0.0–149.0)
VLDL: 10.4 mg/dL (ref 0.0–40.0)

## 2020-05-01 LAB — PSA: PSA: 1.71 ng/mL (ref 0.10–4.00)

## 2020-05-01 NOTE — Telephone Encounter (Signed)
Patient requesting refill on pending medication last OV 04/28/20.Please advise.

## 2020-05-10 ENCOUNTER — Telehealth: Payer: Self-pay | Admitting: Family Medicine

## 2020-05-10 NOTE — Telephone Encounter (Signed)
Patient aware of labs.  

## 2020-05-10 NOTE — Telephone Encounter (Signed)
Patient is calling back to see if lab results were in yet, please advise. CB is [phone]

## 2020-05-29 ENCOUNTER — Other Ambulatory Visit: Payer: Self-pay | Admitting: Family Medicine

## 2020-05-29 DIAGNOSIS — M25512 Pain in left shoulder: Secondary | ICD-10-CM

## 2020-09-15 ENCOUNTER — Other Ambulatory Visit: Payer: Self-pay | Admitting: Family Medicine

## 2020-09-15 DIAGNOSIS — M25512 Pain in left shoulder: Secondary | ICD-10-CM

## 2020-12-19 ENCOUNTER — Other Ambulatory Visit: Payer: Self-pay | Admitting: Family

## 2020-12-19 DIAGNOSIS — M25512 Pain in left shoulder: Secondary | ICD-10-CM

## 2021-01-08 ENCOUNTER — Other Ambulatory Visit: Payer: Self-pay

## 2021-01-08 DIAGNOSIS — M25512 Pain in left shoulder: Secondary | ICD-10-CM

## 2021-01-08 MED ORDER — MELOXICAM 15 MG PO TABS: ORAL_TABLET | ORAL | 0 refills | Status: DC

## 2021-04-08 ENCOUNTER — Other Ambulatory Visit: Payer: Self-pay | Admitting: Family Medicine

## 2021-04-08 DIAGNOSIS — M25512 Pain in left shoulder: Secondary | ICD-10-CM

## 2021-04-09 ENCOUNTER — Other Ambulatory Visit: Payer: Self-pay

## 2021-04-09 ENCOUNTER — Encounter: Payer: Self-pay | Admitting: Physical Therapy

## 2021-04-09 ENCOUNTER — Ambulatory Visit: Payer: No Typology Code available for payment source | Attending: Neurological Surgery | Admitting: Physical Therapy

## 2021-04-09 DIAGNOSIS — M5442 Lumbago with sciatica, left side: Secondary | ICD-10-CM | POA: Diagnosis present

## 2021-04-09 DIAGNOSIS — M6281 Muscle weakness (generalized): Secondary | ICD-10-CM | POA: Diagnosis present

## 2021-04-09 DIAGNOSIS — M5441 Lumbago with sciatica, right side: Secondary | ICD-10-CM | POA: Diagnosis present

## 2021-04-09 DIAGNOSIS — R262 Difficulty in walking, not elsewhere classified: Secondary | ICD-10-CM | POA: Diagnosis present

## 2021-04-09 DIAGNOSIS — R29898 Other symptoms and signs involving the musculoskeletal system: Secondary | ICD-10-CM | POA: Diagnosis present

## 2021-04-09 NOTE — Therapy (Signed)
[facility] [address] [place], [place], [address] Phone: [phone]   Fax:  [phone]  Physical Therapy Evaluation  Patient Details  Name: Raymond Stein MRN: [id number] Date of Birth: [birth year] Referring Provider (PT): Autumn Pattyhomas Ostergard, MD   Encounter Date: 04/09/2021   PT End of Session - 04/09/21 1650     Visit Number 1    Number of Visits 7    Date for PT Re-Evaluation 05/21/21    Authorization Type [employer]    PT Start Time 1608    PT Stop Time 1648    PT Time Calculation (min) 40 min    Activity Tolerance Patient tolerated treatment well;Patient limited by pain    Behavior During Therapy [facility] for tasks assessed/performed             History reviewed. No pertinent past medical history.  History reviewed. No pertinent surgical history.  There were no vitals filed for this visit.    Subjective Assessment - 04/09/21 1610     Subjective Patient reports undergoing a L4-5 microdiscectomy on 02/21/21. Pain has been "up and down." Prior to surgery, pain radiated to L LE, however now the pain is localized in the R LB and buttock. Still has N/T in the B posterior thighs. Denies B&B changes. Worse with standing, walking, however walking sometimes helps. Has avoided bending, lifting, twisting. Incision is fully healed but is still TTP. Currently using RW to go to the restroom.    Pertinent History none    Limitations Lifting;Standing;Walking;House hold activities    How long can you sit comfortably? 30-45 min    How long can you stand comfortably? 30 min    How long can you walk comfortably? 30 min    Diagnostic tests 04/29/20 lumbar xray: Degenerative change without acute abnormality.    Patient Stated Goals decrease pain    Currently in Pain? Yes    Pain Score 5     Pain Location Buttocks    Pain Orientation Right    Pain Descriptors / Indicators Dull;Sharp    Pain Type Acute pain;Chronic pain                 [facility] PT Assessment - 04/09/21 1616       Assessment   Medical Diagnosis lumbar radiculopathy; s/p lumbar microdiscectomy    Referring Provider (PT) Autumn Pattyhomas Ostergard, MD    Onset Date/Surgical Date 02/21/21    Next MD Visit 05/02/21    Prior Therapy no      Precautions   Precautions None      Balance Screen   Has the patient fallen in the past 6 months No    Has the patient had a decrease in activity level because of a fear of falling?  No    Is the patient reluctant to leave their home because of a fear of falling?  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, walking, pulling/pushing, bending    Leisure fishing      Cognition   Overall Cognitive Status Within Functional Limits for tasks assessed      Sensation   Light Touch Appears Intact      Coordination   Gross Motor Movements are Fluid and Coordinated Yes      Posture/Postural Control   Posture/Postural Control Postural limitations    Postural Limitations Rounded Shoulders;Forward head      ROM / Strength   AROM / PROM / Strength AROM;Strength      AROM   Overall AROM Comments pain with all planes    AROM Assessment Site Lumbar    Lumbar Flexion knees   pain upon coming up to stand   Lumbar Extension to neutral    Lumbar - Right Side Bend distal thigh    Lumbar - Left Side Bend distal thigh    Lumbar - Right Rotation moderately limited     Lumbar - Left Rotation moderately limited      Strength   Strength Assessment Site Hip;Knee;Ankle    Right/Left Hip Right;Left    Right Hip Flexion 4/5    Right Hip ABduction 4-/5    Right Hip ADduction 4-/5    Left Hip Flexion 4/5    Left Hip ABduction 4-/5    Left Hip ADduction 4-/5    Right/Left Knee Right;Left    Right Knee Flexion 4/5    Right Knee Extension 4/5    Left Knee Flexion 4+/5    Left Knee Extension 4/5    Right/Left Ankle Right;Left    Right Ankle Dorsiflexion 4+/5    Right Ankle Plantar Flexion 4/5    Left Ankle Dorsiflexion 4+/5    Left Ankle Plantar Flexion 4/5      Flexibility   Soft Tissue Assessment /Muscle Length yes    Piriformis moderately tight B in fig 4 and KTOS      Palpation   Palpation comment central lumbar incisiaon fully healed but with area of swelling/fluid accumulation just superior to it; TTP along midline lumbar spine, B QL, B buttocks; considerable soft tissue restriction L>R QL      Ambulation/Gait   Assistive device None    Gait Pattern Step-through pattern;Lateral trunk lean to right;Lateral trunk lean to left;Trunk flexed    Ambulation Surface Level;Indoor    Gait velocity decreased                        Objective measurements completed on examination: See above findings.               PT Education - 04/09/21 1650     Education Details prognosis, POC, HEP- advised patient to avoid pushing inot pain    Person(s) Educated Patient;Spouse   wife   Methods Explanation;Demonstration;Tactile cues;Verbal cues;Handout    Comprehension Verbalized understanding;Returned demonstration              PT Short Term Goals - 04/09/21 1802       PT SHORT TERM GOAL #1   Title Patient to be independent with initial HEP.    Time 3    Period Weeks    Status New    Target Date 04/30/21               PT Long Term Goals - 04/09/21 1802  PT LONG TERM GOAL #1   Title Patient to be independent  with advanced HEP.    Time 6    Period Weeks    Status New    Target Date 05/21/21      PT LONG TERM GOAL #2   Title Patient to demonstrate lumbar AROM WFL and without pain limiting.    Time 6    Period Weeks    Status New    Target Date 05/21/21      PT LONG TERM GOAL #3   Title Patient to demonstrate B LE strength >/=4+/5.    Time 6    Period Weeks    Status New    Target Date 05/21/21      PT LONG TERM GOAL #4   Title Patient to lift 20lbs from floor to chest with good body mechanics and no pain.    Time 6    Period Weeks    Status New    Target Date 05/21/21                    Plan - 04/09/21 1650     Clinical Impression Statement Patient is a 65 y/o M presenting to OPPT with c/o R sided LB and buttock pain s/p L4-5 microdiscectomy on 02/21/21. Pain prior to surgery was located down the L LE, thus notes that this pain is new. Notes remaining N/T in the B posterior thighs. Denies B&B changes. Pain is worse with walking and standing. Patient reports that he has avoided bending, lifting, twisting since his surgery. Patient today presenting with rounded shoulders and forward head posture, considerably limited and painful lumbar AROM, decreased B LE strength, tightness in B piriformis, and TTP along midline lumbar spine, B QL, B buttocks, and gait deviations. Patient was educated on gentle stretching and strengthening HEP and reported understanding. Would benefit from skilled PT services 1x/week for 6 weeks to address aforementioned impairments.    Personal Factors and Comorbidities Age;Behavior Pattern;Time since onset of injury/illness/exacerbation;Past/Current Experience;Profession    Examination-Activity Limitations Locomotion Level;Transfers;Bed Mobility;Reach Overhead;Bend;Carry;Sleep;Squat;Stairs;Dressing;Hygiene/Grooming;Stand;Lift    Examination-Participation Restrictions [employer];Shop;Driving;Community Activity;Occupation;Cleaning;Church;Meal Prep     Stability/Clinical Decision Making Evolving/Moderate complexity    Clinical Decision Making Moderate    Rehab Potential Good    PT Frequency 1x / week    PT Duration 6 weeks    PT Treatment/Interventions ADLs/Self Care Home Management;Cryotherapy;Electrical Stimulation;Ultrasound;Traction;Moist Heat;Gait training;Stair training;Functional mobility training;Therapeutic activities;Therapeutic exercise;Balance training;Neuromuscular re-education;Manual techniques;Patient/family education;Passive range of motion;Dry needling;Energy conservation;Vasopneumatic Device;Taping    PT Next Visit Plan lumbar FOTO; reassess HEP; progress gentle lumbar and hip ROM and core/hip strength    Consulted and Agree with Plan of Care Patient;Family member/caregiver    Family Member Consulted wife             Patient will benefit from skilled therapeutic intervention in order to improve the following deficits and impairments:  Decreased range of motion, Difficulty walking, Increased fascial restricitons, Increased muscle spasms, Decreased safety awareness, Decreased activity tolerance, Pain, Decreased balance, Hypomobility, Impaired flexibility, Improper body mechanics, Postural dysfunction, Decreased strength  Visit Diagnosis: Acute right-sided low back pain with bilateral sciatica  Muscle weakness (generalized)  Other symptoms and signs involving the musculoskeletal system  Difficulty in walking, not elsewhere classified     Problem List Patient Active Problem List   Diagnosis Date Noted   Right-sided low back pain without sciatica 04/29/2020   Acute pain of left shoulder 11/07/2018   History of hematuria 10/07/2018   History  of pneumonia 10/07/2018   Healthcare maintenance 01/17/2018   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 [place], [place],  [address] Phone: [phone]   Fax:  [phone]  Name: Raymond Stein MRN: [id number] Date of Birth: [birth year]

## 2021-04-09 NOTE — Patient Instructions (Signed)
Access Code: [email or web address] URL: [email or web address] Date: 04/09/2021 Prepared by: Georgina Peer  Exercises Supine Piriformis Stretch with Foot on Ground - 2 x daily - 7 x weekly - 2 sets - 30 sec hold Supine Figure 4 Piriformis Stretch - 2 x daily - 7 x weekly - 2 sets - 30 sec hold Supine Bridge with Resistance Band - 2 x daily - 7 x weekly - 2 sets - 10 reps Sidelying Thoracic Rotation with Open Book - 2 x daily - 7 x weekly - 2 sets - 10 reps Seated Thoracic Flexion and Rotation with Swiss Ball - 2 x daily - 7 x weekly - 2 sets - 10 reps - 5 sec hold

## 2021-04-15 ENCOUNTER — Encounter: Payer: No Typology Code available for payment source | Admitting: Physical Therapy

## 2021-04-16 ENCOUNTER — Encounter: Payer: Self-pay | Admitting: Physical Therapy

## 2021-04-16 ENCOUNTER — Ambulatory Visit: Payer: No Typology Code available for payment source | Admitting: Physical Therapy

## 2021-04-16 ENCOUNTER — Other Ambulatory Visit: Payer: Self-pay

## 2021-04-16 DIAGNOSIS — R29898 Other symptoms and signs involving the musculoskeletal system: Secondary | ICD-10-CM

## 2021-04-16 DIAGNOSIS — M6281 Muscle weakness (generalized): Secondary | ICD-10-CM

## 2021-04-16 DIAGNOSIS — M5442 Lumbago with sciatica, left side: Secondary | ICD-10-CM

## 2021-04-16 DIAGNOSIS — M5441 Lumbago with sciatica, right side: Secondary | ICD-10-CM

## 2021-04-16 DIAGNOSIS — R262 Difficulty in walking, not elsewhere classified: Secondary | ICD-10-CM

## 2021-04-16 NOTE — Therapy (Signed)
[facility] [place] [facility] [address]. [place], [place], [address] Phone: [phone]   Fax:  [phone]  Physical Therapy Treatment  Patient Details  Name: Raymond Stein MRN: [id number] Date of Birth: February 24, 1956 Referring Provider (PT): Autumn Patty, MD   Encounter Date: 04/16/2021   PT End of Session - 04/16/21 1554     Visit Number 2    Date for PT Re-Evaluation 05/21/21    Authorization Type Aetna    PT Start Time 1311    PT Stop Time 1555    PT Time Calculation (min) 164 min    Activity Tolerance Patient tolerated treatment well;Patient limited by pain    Behavior During Therapy [facility] for tasks assessed/performed             History reviewed. No pertinent past medical history.  History reviewed. No pertinent surgical history.  There were no vitals filed for this visit.   Subjective Assessment - 04/16/21 1513     Subjective No change since evaluation. Pt reports HEP compliance    Currently in Pain? Yes    Pain Score 5     Pain Location Back    Pain Orientation Right;Lower                               [facility] Adult PT Treatment/Exercise - 04/16/21 0001       Exercises   Exercises Lumbar      Lumbar Exercises: Aerobic   Nustep L3 x6 min      Lumbar Exercises: Standing   Shoulder Extension Strengthening;20 reps;Theraband    Theraband Level (Shoulder Extension) Level 2 (Red)      Lumbar Exercises: Seated   Long Arc Quad on Chair Strengthening;Both;2 sets;10 reps    LAQ on Chair Weights (lbs) 2    Sit to Stand 5 reps   x2; elevated mat table, UE assist   Other Seated Lumbar Exercises Hamstring curls green 2x10; Seated Rows red 2x10    Other Seated Lumbar Exercises March 2lb 2x10; Seated ab sets 2x10                      PT Short Term Goals - 04/16/21 1601       PT SHORT TERM GOAL #1   Title Patient to be independent with initial HEP.    Status Achieved                PT Long Term Goals - 04/09/21 1802       PT LONG TERM GOAL #1   Title Patient to be independent with advanced HEP.    Time 6    Period Weeks    Status New    Target Date 05/21/21      PT LONG TERM GOAL #2   Title Patient to demonstrate lumbar AROM WFL and without pain limiting.    Time 6    Period Weeks    Status New    Target Date 05/21/21      PT LONG TERM GOAL #3   Title Patient to demonstrate B LE strength >/=4+/5.    Time 6    Period Weeks    Status New    Target Date 05/21/21      PT LONG TERM GOAL #4   Title Patient to lift 20lbs from floor to chest with good body mechanics and no pain.    Time 6  Period Weeks    Status New    Target Date 05/21/21                   Plan - 04/16/21 1555     Clinical Impression Statement Pt with a very stiff rigid posture throughout therapy session. He reports a constant 5/10 pain. Pt very gingerly with his movements while performing exercise interventions. Postural cue required when doing seated exercise interventions. Pt reports a slight increase in pain with standing shoulder extensions. Cues to bring hips forward needed with sit to stands.    Personal Factors and Comorbidities Age;Behavior Pattern;Time since onset of injury/illness/exacerbation;Past/Current Experience;Profession    Examination-Activity Limitations Locomotion Level;Transfers;Bed Mobility;Reach Overhead;Bend;Carry;Sleep;Squat;Stairs;Dressing;Hygiene/Grooming;Stand;Lift    Examination-Participation Restrictions [employer];Shop;Driving;Community Activity;Occupation;Cleaning;Church;Meal Prep    Stability/Clinical Decision Making Evolving/Moderate complexity    Rehab Potential Good    PT Frequency 1x / week    PT Duration 6 weeks    PT Treatment/Interventions ADLs/Self Care Home Management;Cryotherapy;Electrical Stimulation;Ultrasound;Traction;Moist Heat;Gait training;Stair training;Functional mobility training;Therapeutic  activities;Therapeutic exercise;Balance training;Neuromuscular re-education;Manual techniques;Patient/family education;Passive range of motion;Dry needling;Energy conservation;Vasopneumatic Device;Taping    PT Next Visit Plan progress gentle lumbar and hip ROM and core/hip strength             Patient will benefit from skilled therapeutic intervention in order to improve the following deficits and impairments:  Decreased range of motion, Difficulty walking, Increased fascial restricitons, Increased muscle spasms, Decreased safety awareness, Decreased activity tolerance, Pain, Decreased balance, Hypomobility, Impaired flexibility, Improper body mechanics, Postural dysfunction, Decreased strength  Visit Diagnosis: Muscle weakness (generalized)  Difficulty in walking, not elsewhere classified  Acute right-sided low back pain with bilateral sciatica  Other symptoms and signs involving the musculoskeletal system     Problem List Patient Active Problem List   Diagnosis Date Noted   Right-sided low back pain without sciatica 04/29/2020   Acute pain of left shoulder 11/07/2018   History of hematuria 10/07/2018   History of pneumonia 10/07/2018   Healthcare maintenance 01/17/2018   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 [place], [place], [address] Phone: [phone]   Fax:  [phone]  Name: Hong Moring MRN: [id number] Date of Birth: 06/27/1956

## 2021-04-24 ENCOUNTER — Ambulatory Visit: Payer: No Typology Code available for payment source | Admitting: Physical Therapy

## 2021-04-24 ENCOUNTER — Other Ambulatory Visit: Payer: Self-pay

## 2021-04-24 ENCOUNTER — Encounter: Payer: Self-pay | Admitting: Physical Therapy

## 2021-04-24 DIAGNOSIS — R262 Difficulty in walking, not elsewhere classified: Secondary | ICD-10-CM

## 2021-04-24 DIAGNOSIS — R29898 Other symptoms and signs involving the musculoskeletal system: Secondary | ICD-10-CM

## 2021-04-24 DIAGNOSIS — M5441 Lumbago with sciatica, right side: Secondary | ICD-10-CM

## 2021-04-24 DIAGNOSIS — M6281 Muscle weakness (generalized): Secondary | ICD-10-CM

## 2021-04-24 DIAGNOSIS — M5442 Lumbago with sciatica, left side: Secondary | ICD-10-CM

## 2021-04-24 NOTE — Therapy (Signed)
[facility] [place] [facility] [address] [place], [place], [address] Phone: [phone]   Fax:  [phone]  Physical Therapy Treatment  Patient Details  Name: Raymond Stein MRN: [id number] Date of Birth: 1955/11/13 Referring Provider (PT): Autumn Patty, MD   Encounter Date: 04/24/2021   PT End of Session - 04/24/21 1529     Visit Number 3    Number of Visits 7    Date for PT Re-Evaluation 05/21/21    Authorization Type Aetna    PT Start Time 1446    PT Stop Time 1538    PT Time Calculation (min) 52 min    Activity Tolerance Patient tolerated treatment well    Behavior During Therapy [facility] for tasks assessed/performed             History reviewed. No pertinent past medical history.  History reviewed. No pertinent surgical history.  There were no vitals filed for this visit.   Subjective Assessment - 04/24/21 1447     Subjective Still dealing with LBP and pain radiating down the R LE. Has been doing his HEP exept the one with the towel- not sure if hes doing it right.    Pertinent History none    Diagnostic tests 04/29/20 lumbar xray: Degenerative change without acute abnormality.    Patient Stated Goals decrease pain    Currently in Pain? Yes    Pain Score 5     Pain Location Back    Pain Orientation Right;Left    Pain Descriptors / Indicators Sharp    Pain Type Acute pain;Chronic pain    Pain Radiating Towards down R LE                [facility] PT Assessment - 04/24/21 1457       Observation/Other Assessments   Focus on Therapeutic Outcomes (FOTO)  Lumbar: 35                           [facility] Adult PT Treatment/Exercise - 04/24/21 0001       Lumbar Exercises: Stretches   Piriformis Stretch Right;Left;1 rep;30 seconds    Piriformis Stretch Limitations supine KTOS    Figure 4 Stretch 1 rep;2 reps;30 seconds;With overpressure    Figure 4 Stretch Limitations supine    Other Lumbar Stretch Exercise  R/L prayer stretch forward with pillow case 5x5", R/L 5x5"      Lumbar Exercises: Aerobic   Nustep L4 x6 min (UEs/LEs)      Lumbar Exercises: Supine   Bridge with [employer] 10 reps    Bridge with [employer] Limitations limited ROM    Bridge with clamshell 10 reps   red TB     Lumbar Exercises: Sidelying   Hip Abduction Right;Left;10 reps    Hip Abduction Limitations cues for alignment    Other Sidelying Lumbar Exercises R/L hip adduction with LE on bolster 10x each    Other Sidelying Lumbar Exercises R/L open book stretch 5x each   advised to perform to tolerance     Modalities   Modalities Primary school teacher Stimulation Location B lumbar paraspinals    Electrical Stimulation Action IFC    Electrical Stimulation Parameters 80-150hz ; output to tolerance; 10 min    Electrical Stimulation Goals Pain  PT Short Term Goals - 04/16/21 1601       PT SHORT TERM GOAL #1   Title Patient to be independent with initial HEP.    Status Achieved               PT Long Term Goals - 04/09/21 1802       PT LONG TERM GOAL #1   Title Patient to be independent with advanced HEP.    Time 6    Period Weeks    Status New    Target Date 05/21/21      PT LONG TERM GOAL #2   Title Patient to demonstrate lumbar AROM WFL and without pain limiting.    Time 6    Period Weeks    Status New    Target Date 05/21/21      PT LONG TERM GOAL #3   Title Patient to demonstrate B LE strength >/=4+/5.    Time 6    Period Weeks    Status New    Target Date 05/21/21      PT LONG TERM GOAL #4   Title Patient to lift 20lbs from floor to chest with good body mechanics and no pain.    Time 6    Period Weeks    Status New    Target Date 05/21/21                   Plan - 04/24/21 1530     Clinical Impression Statement Patient arrived to session without new complaints. Noting some confusion on HEP.  Reviewed prayer stretch with patient tolerating this well and noting improved understanding. Amplitude of bridges appeared very limited d/t weakness. Cues provided to encourage glute contraction and avoid lumbar muscular compensations d/t pain. Encouraged patient to monitor his tolerance for HEP and discontinue if pain is severe or does not dissipate- patient reported understanding. Corrective cues provided with trunk rotation stretching to avoid compensation at knees. Initiated sidelying hip strengthening with patient reporting tenderness in LB and with quite limited ROM d/t weakness B. Overall patient painful with most activities today, however not limited by pain and never reaching severe pain. Ended session with e-stim to LBP to address pain. No complaints at end of session.    Personal Factors and Comorbidities Age;Behavior Pattern;Time since onset of injury/illness/exacerbation;Past/Current Experience;Profession    Examination-Activity Limitations Locomotion Level;Transfers;Bed Mobility;Reach Overhead;Bend;Carry;Sleep;Squat;Stairs;Dressing;Hygiene/Grooming;Stand;Lift    Examination-Participation Restrictions [employer];Shop;Driving;Community Activity;Occupation;Cleaning;Church;Meal Prep    Stability/Clinical Decision Making Evolving/Moderate complexity    Rehab Potential Good    PT Frequency 1x / week    PT Duration 6 weeks    PT Treatment/Interventions ADLs/Self Care Home Management;Cryotherapy;Electrical Stimulation;Ultrasound;Traction;Moist Heat;Gait training;Stair training;Functional mobility training;Therapeutic activities;Therapeutic exercise;Balance training;Neuromuscular re-education;Manual techniques;Patient/family education;Passive range of motion;Dry needling;Energy conservation;Vasopneumatic Device;Taping    PT Next Visit Plan progress gentle lumbar and hip ROM and core/hip strength    Consulted and Agree with Plan of Care Patient             Patient will benefit from  skilled therapeutic intervention in order to improve the following deficits and impairments:  Decreased range of motion, Difficulty walking, Increased fascial restricitons, Increased muscle spasms, Decreased safety awareness, Decreased activity tolerance, Pain, Decreased balance, Hypomobility, Impaired flexibility, Improper body mechanics, Postural dysfunction, Decreased strength  Visit Diagnosis: Muscle weakness (generalized)  Difficulty in walking, not elsewhere classified  Acute right-sided low back pain with bilateral sciatica  Other symptoms and signs involving the musculoskeletal system     Problem  List Patient Active Problem List   Diagnosis Date Noted   Right-sided low back pain without sciatica 04/29/2020   Acute pain of left shoulder 11/07/2018   History of hematuria 10/07/2018   History of pneumonia 10/07/2018   Healthcare maintenance 01/17/2018   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 [place], [place], [address] Phone: [phone]   Fax:  [phone]  Name: Raymond Stein MRN: [id number] Date of Birth: Sep 24, 1955

## 2021-05-01 ENCOUNTER — Encounter: Payer: Self-pay | Admitting: Physical Therapy

## 2021-05-01 ENCOUNTER — Encounter: Payer: No Typology Code available for payment source | Admitting: Family Medicine

## 2021-05-01 ENCOUNTER — Ambulatory Visit: Payer: No Typology Code available for payment source | Admitting: Physical Therapy

## 2021-05-01 ENCOUNTER — Other Ambulatory Visit: Payer: Self-pay

## 2021-05-01 DIAGNOSIS — R262 Difficulty in walking, not elsewhere classified: Secondary | ICD-10-CM

## 2021-05-01 DIAGNOSIS — R29898 Other symptoms and signs involving the musculoskeletal system: Secondary | ICD-10-CM

## 2021-05-01 DIAGNOSIS — M5442 Lumbago with sciatica, left side: Secondary | ICD-10-CM | POA: Diagnosis not present

## 2021-05-01 DIAGNOSIS — M5441 Lumbago with sciatica, right side: Secondary | ICD-10-CM

## 2021-05-01 DIAGNOSIS — M6281 Muscle weakness (generalized): Secondary | ICD-10-CM

## 2021-05-01 NOTE — Therapy (Signed)
[facility] [place] [facility] [address] [place], [place], [address] Phone: [phone]   Fax:  [phone]  Physical Therapy Treatment  Patient Details  Name: Raymond Stein MRN: [id number] Date of Birth: Jan 29, 1956 Referring Provider (PT): Autumn Patty, MD   Encounter Date: 05/01/2021   PT End of Session - 05/01/21 1509     Visit Number 4    Date for PT Re-Evaluation 05/21/21    Authorization Type Aetna    PT Start Time 1430    PT Stop Time 1519    PT Time Calculation (min) 49 min    Activity Tolerance Patient tolerated treatment well    Behavior During Therapy [facility] for tasks assessed/performed             History reviewed. No pertinent past medical history.  History reviewed. No pertinent surgical history.  There were no vitals filed for this visit.   Subjective Assessment - 05/01/21 1430     Subjective Still having pain across lower back down into RLE. Usually after prolong sitting and walking    Currently in Pain? Yes    Pain Score 4     Pain Location Back    Pain Orientation Right                               [facility] Adult PT Treatment/Exercise - 05/01/21 0001       Lumbar Exercises: Stretches   Passive Hamstring Stretch Left;Right;3 reps;10 seconds    Single Knee to Chest Stretch Right;Left;3 reps;10 seconds    Piriformis Stretch Left;Right;3 reps;10 seconds    Piriformis Stretch Limitations supine KTOS      Lumbar Exercises: Aerobic   Recumbent Bike L2  3 min    Nustep L4 x5 min (UEs/LEs)      Lumbar Exercises: Standing   Shoulder Extension Strengthening;20 reps;Both;Power Tower    Theraband Level (Shoulder Extension) Level 2 (Red)    Other Standing Lumbar Exercises 20lb resisted side step x2 each      Lumbar Exercises: Supine   Bridge with [employer] 10 reps    Bridge with [employer] Limitations limited ROM    Other Supine Lumbar Exercises LE on pball K2C x10    Other Supine  Lumbar Exercises Ab set w/ march 2x5      Electrical Stimulation   Electrical Stimulation Location B lumbar paraspinals    Electrical Stimulation Action IFC    Electrical Stimulation Parameters to pt tolerance    Electrical Stimulation Goals Pain                      PT Short Term Goals - 04/16/21 1601       PT SHORT TERM GOAL #1   Title Patient to be independent with initial HEP.    Status Achieved               PT Long Term Goals - 04/09/21 1802       PT LONG TERM GOAL #1   Title Patient to be independent with advanced HEP.    Time 6    Period Weeks    Status New    Target Date 05/21/21      PT LONG TERM GOAL #2   Title Patient to demonstrate lumbar AROM WFL and without pain limiting.    Time 6    Period Weeks    Status New  Target Date 05/21/21      PT LONG TERM GOAL #3   Title Patient to demonstrate B LE strength >/=4+/5.    Time 6    Period Weeks    Status New    Target Date 05/21/21      PT LONG TERM GOAL #4   Title Patient to lift 20lbs from floor to chest with good body mechanics and no pain.    Time 6    Period Weeks    Status New    Target Date 05/21/21                   Plan - 05/01/21 1509     Clinical Impression Statement Pt continues to have a guarded stiff posture. Limited hip elevation noted with supine bridges. Tactile cue to maintain erect posture needed with shoulder extentions. Constant cue to relax and allow passive stretching. Pins and needleless sensation reported with passive single knee to chest stretch on L side.    Personal Factors and Comorbidities Age;Behavior Pattern;Time since onset of injury/illness/exacerbation;Past/Current Experience;Profession    Examination-Activity Limitations Locomotion Level;Transfers;Bed Mobility;Reach Overhead;Bend;Carry;Sleep;Squat;Stairs;Dressing;Hygiene/Grooming;Stand;Lift    Examination-Participation Restrictions [employer];Shop;Driving;Community  Activity;Occupation;Cleaning;Church;Meal Prep    Stability/Clinical Decision Making Evolving/Moderate complexity    Rehab Potential Good    PT Frequency 1x / week    PT Duration 6 weeks    PT Treatment/Interventions ADLs/Self Care Home Management;Cryotherapy;Electrical Stimulation;Ultrasound;Traction;Moist Heat;Gait training;Stair training;Functional mobility training;Therapeutic activities;Therapeutic exercise;Balance training;Neuromuscular re-education;Manual techniques;Patient/family education;Passive range of motion;Dry needling;Energy conservation;Vasopneumatic Device;Taping    PT Next Visit Plan progress gentle lumbar and hip ROM and core/hip strength             Patient will benefit from skilled therapeutic intervention in order to improve the following deficits and impairments:  Decreased range of motion, Difficulty walking, Increased fascial restricitons, Increased muscle spasms, Decreased safety awareness, Decreased activity tolerance, Pain, Decreased balance, Hypomobility, Impaired flexibility, Improper body mechanics, Postural dysfunction, Decreased strength  Visit Diagnosis: Difficulty in walking, not elsewhere classified  Acute right-sided low back pain with bilateral sciatica  Other symptoms and signs involving the musculoskeletal system  Muscle weakness (generalized)     Problem List Patient Active Problem List   Diagnosis Date Noted   Right-sided low back pain without sciatica 04/29/2020   Acute pain of left shoulder 11/07/2018   History of hematuria 10/07/2018   History of pneumonia 10/07/2018   Healthcare maintenance 01/17/2018   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 [place], [place], [address] Phone: [phone]   Fax:  [phone]  Name: Raymond Stein MRN: [id number] Date of Birth: 1956/04/23

## 2021-05-08 ENCOUNTER — Encounter: Payer: Self-pay | Admitting: Physical Therapy

## 2021-05-08 ENCOUNTER — Other Ambulatory Visit: Payer: Self-pay

## 2021-05-08 ENCOUNTER — Ambulatory Visit: Payer: No Typology Code available for payment source | Attending: Neurological Surgery | Admitting: Physical Therapy

## 2021-05-08 DIAGNOSIS — M6281 Muscle weakness (generalized): Secondary | ICD-10-CM | POA: Insufficient documentation

## 2021-05-08 DIAGNOSIS — M5441 Lumbago with sciatica, right side: Secondary | ICD-10-CM | POA: Diagnosis present

## 2021-05-08 DIAGNOSIS — M5442 Lumbago with sciatica, left side: Secondary | ICD-10-CM | POA: Diagnosis present

## 2021-05-08 DIAGNOSIS — R262 Difficulty in walking, not elsewhere classified: Secondary | ICD-10-CM | POA: Diagnosis not present

## 2021-05-08 DIAGNOSIS — R29898 Other symptoms and signs involving the musculoskeletal system: Secondary | ICD-10-CM | POA: Insufficient documentation

## 2021-05-08 NOTE — Therapy (Signed)
[place]. [place], [place], [address] Phone: [phone]   Fax:  [phone]  Physical Therapy Treatment  Patient Details  Name: Raymond Stein MRN: [id number] Date of Birth: [birth year] Referring Provider (PT): Emelda Brothers, MD   Encounter Date: 05/08/2021   PT End of Session - 05/08/21 1513     Visit Number 5    Date for PT Re-Evaluation 05/21/21    Authorization Type Aetna    PT Start Time 1430    PT Stop Time 1523    PT Time Calculation (min) 53 min    Activity Tolerance Patient tolerated treatment well    Behavior During Therapy [facility] for tasks assessed/performed             History reviewed. No pertinent past medical history.  History reviewed. No pertinent surgical history.  There were no vitals filed for this visit.   Subjective Assessment - 05/08/21 1428     Subjective Went to MD, x-rays looked good, started prednisone and it  helped some. Continues to have low back pain    Limitations Lifting;Standing;Walking;House hold activities    Currently in Pain? Yes    Pain Score 4     Pain Location Back    Pain Orientation Right                               [facility] Adult PT Treatment/Exercise - 05/08/21 0001       Lumbar Exercises: Aerobic   Recumbent Bike L2  3 min    Nustep L4 x5 min (UEs/LEs)      Lumbar Exercises: Machines for Strengthening   Other Lumbar Machine Exercise rows 20lb 2x10    Other Lumbar Machine Exercise lats 20lb x10      Lumbar Exercises: Standing   Shoulder Extension Strengthening;20 reps;Both;Power Tower    Theraband Level (Shoulder Extension) Level 2 (Red)    Other Standing Lumbar Exercises 20lb resisted side step x3 each      Lumbar Exercises: Seated   Long Arc Quad on Chair Strengthening;Both;2 sets;10 reps      Acupuncturist Location B lumbar paraspinals    Electrical Stimulation Action IFC     Electrical Stimulation Parameters ot pt tolerance    Electrical Stimulation Goals Pain                      PT Short Term Goals - 04/16/21 1601       PT SHORT TERM GOAL #1   Title Patient to be independent with initial HEP.    Status Achieved               PT Long Term Goals - 05/08/21 1516       PT LONG TERM GOAL #1   Title Patient to be independent with advanced HEP.    Status Partially Met      PT LONG TERM GOAL #2   Title Patient to demonstrate lumbar AROM WFL and without pain limiting.    Status On-going      PT LONG TERM GOAL #3   Title Patient to demonstrate B LE strength >/=4+/5.    Status On-going                   Plan - 05/08/21 1514     Clinical Impression Statement Pt reports that he just completed a trial  of prednisone with little improvement. Will receive another MRI if he does not improve. Stiff rigid posture throughout. No issues with the addition of step ups. Instability remains with resisted side step. Reports pain with seated lat pull down only completing one set.    Personal Factors and Comorbidities Age;Behavior Pattern;Time since onset of injury/illness/exacerbation;Past/Current Experience;Profession    Examination-Activity Limitations Locomotion Level;Transfers;Bed Mobility;Reach Overhead;Bend;Carry;Sleep;Squat;Stairs;Dressing;Hygiene/Grooming;Stand;Lift    Examination-Participation Restrictions [employer];Shop;Driving;Community Activity;Occupation;Cleaning;Church;Meal Prep    Stability/Clinical Decision Making Evolving/Moderate complexity    Rehab Potential Good    PT Frequency 1x / week    PT Duration 6 weeks    PT Treatment/Interventions ADLs/Self Care Home Management;Cryotherapy;Electrical Stimulation;Ultrasound;Traction;Moist Heat;Gait training;Stair training;Functional mobility training;Therapeutic activities;Therapeutic exercise;Balance training;Neuromuscular re-education;Manual techniques;Patient/family  education;Passive range of motion;Dry needling;Energy conservation;Vasopneumatic Device;Taping    PT Next Visit Plan progress gentle lumbar and hip ROM and core/hip strength             Patient will benefit from skilled therapeutic intervention in order to improve the following deficits and impairments:  Decreased range of motion, Difficulty walking, Increased fascial restricitons, Increased muscle spasms, Decreased safety awareness, Decreased activity tolerance, Pain, Decreased balance, Hypomobility, Impaired flexibility, Improper body mechanics, Postural dysfunction, Decreased strength  Visit Diagnosis: Difficulty in walking, not elsewhere classified  Acute right-sided low back pain with bilateral sciatica  Muscle weakness (generalized)  Other symptoms and signs involving the musculoskeletal system     Problem List Patient Active Problem List   Diagnosis Date Noted   Right-sided low back pain without sciatica 04/29/2020   Acute pain of left shoulder 11/07/2018   History of hematuria 10/07/2018   History of pneumonia 10/07/2018   Healthcare maintenance 01/17/2018   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 [place], [place], [address] Phone: [phone]   Fax:  [phone]  Name: Raymond Stein MRN: [id number] Date of Birth: 21-Mar-1956

## 2021-05-15 ENCOUNTER — Encounter: Payer: Self-pay | Admitting: Physical Therapy

## 2021-05-15 ENCOUNTER — Other Ambulatory Visit: Payer: Self-pay

## 2021-05-15 ENCOUNTER — Ambulatory Visit: Payer: No Typology Code available for payment source | Admitting: Physical Therapy

## 2021-05-15 DIAGNOSIS — M6281 Muscle weakness (generalized): Secondary | ICD-10-CM

## 2021-05-15 DIAGNOSIS — R262 Difficulty in walking, not elsewhere classified: Secondary | ICD-10-CM

## 2021-05-15 DIAGNOSIS — M5442 Lumbago with sciatica, left side: Secondary | ICD-10-CM

## 2021-05-15 DIAGNOSIS — R29898 Other symptoms and signs involving the musculoskeletal system: Secondary | ICD-10-CM

## 2021-05-15 DIAGNOSIS — M5441 Lumbago with sciatica, right side: Secondary | ICD-10-CM

## 2021-05-15 NOTE — Therapy (Signed)
[place] [place], [place], [address] Phone: [phone]   Fax:  [phone]  Physical Therapy Treatment  Patient Details  Name: Raymond Stein MRN: [id number] Date of Birth: 18-Aug-1956 Referring Provider (PT): Emelda Brothers, MD   Encounter Date: 05/15/2021   PT End of Session - 05/15/21 1510     Visit Number 6    Date for PT Re-Evaluation 05/21/21    PT Start Time 1430    PT Stop Time 1521    PT Time Calculation (min) 51 min    Activity Tolerance Patient limited by pain    Behavior During Therapy Raymond Stein for tasks assessed/performed             History reviewed. No pertinent past medical history.  History reviewed. No pertinent surgical history.  There were no vitals filed for this visit.   Subjective Assessment - 05/15/21 1428     Subjective pain in the low back on R side    Currently in Pain? Yes    Pain Score 4     Pain Location Back                               [facility] Adult PT Treatment/Exercise - 05/15/21 0001       Lumbar Exercises: Aerobic   Recumbent Bike L2  3 min    Nustep L4 x5 min (UEs/LEs)      Lumbar Exercises: Machines for Strengthening   Cybex Knee Flexion 20lb 2x10    Leg Press 20lb 2x10      Lumbar Exercises: Standing   Row Strengthening;Theraband;20 reps;Both    Theraband Level (Row) Level 3 (Green)    Shoulder Extension Strengthening;20 reps;Both;Power Tower    Theraband Level (Shoulder Extension) Level 2 (Red)      Acupuncturist Location B lumbar paraspinals    Electrical Stimulation Action IFC    Electrical Stimulation Parameters to pt tolerance    Electrical Stimulation Goals Pain                       PT Short Term Goals - 04/16/21 1601       PT SHORT TERM GOAL #1   Title Patient to be independent with initial HEP.    Status Achieved               PT Long Term Goals - 05/08/21  1516       PT LONG TERM GOAL #1   Title Patient to be independent with advanced HEP.    Status Partially Met      PT LONG TERM GOAL #2   Title Patient to demonstrate lumbar AROM WFL and without pain limiting.    Status On-going      PT LONG TERM GOAL #3   Title Patient to demonstrate B LE strength >/=4+/5.    Status On-going                   Plan - 05/15/21 1514     Clinical Impression Statement Pt continues to move low and guarded. Added machine level strengthening to today session. Some low back pain reported on leg press. Weakness in bo th LE noted with seated curl and extensions. Cues needed for core engagement needed with standing rows and extensions.    Personal Factors and Comorbidities Age;Behavior Pattern;Time since onset of injury/illness/exacerbation;Past/Current Experience;Profession  Examination-Activity Limitations Locomotion Level;Transfers;Bed Mobility;Reach Overhead;Bend;Carry;Sleep;Squat;Stairs;Dressing;Hygiene/Grooming;Stand;Lift    Examination-Participation Restrictions Raymond Stein;Shop;Driving;Community Activity;Occupation;Cleaning;Church;Meal Prep    Stability/Clinical Decision Making Evolving/Moderate complexity    Rehab Potential Good    PT Frequency 1x / week    PT Duration 6 weeks    PT Treatment/Interventions ADLs/Self Care Home Management;Cryotherapy;Electrical Stimulation;Ultrasound;Traction;Moist Heat;Gait training;Stair training;Functional mobility training;Therapeutic activities;Therapeutic exercise;Balance training;Neuromuscular re-education;Manual techniques;Patient/family education;Passive range of motion;Dry needling;Energy conservation;Vasopneumatic Device;Taping    PT Next Visit Plan progress gentle lumbar and hip ROM and core/hip strength    PT Home Exercise Plan Access Code: [email or web address]  URL: [email or web address]  Date: 05/15/2021  Prepared by: Raymond Stein    Exercises  Supine Bridge - 1 x daily - 7 x weekly - 3  sets - 10 reps  Supine Active Straight Leg Raise - 1 x daily - 7 x weekly - 3 sets - 10 reps  Standing Row with Anchored Resistance - 1 x daily - 7 x weekly - 3 sets - 10 reps  Standing Row with Anchored Resistance - 1 x daily - 7 x weekly - 3 sets - 10 reps  Shoulder Extension with Resistance - 1 x daily - 7 x weekly - 3 sets - 10 reps             Patient will benefit from skilled therapeutic intervention in order to improve the following deficits and impairments:  Decreased range of motion, Difficulty walking, Increased fascial restricitons, Increased muscle spasms, Decreased safety awareness, Decreased activity tolerance, Pain, Decreased balance, Hypomobility, Impaired flexibility, Improper body mechanics, Postural dysfunction, Decreased strength  Visit Diagnosis: Acute right-sided low back pain with bilateral sciatica  Muscle weakness (generalized)  Difficulty in walking, not elsewhere classified  Other symptoms and signs involving the musculoskeletal system     Problem List Patient Active Problem List   Diagnosis Date Noted   Right-sided low back pain without sciatica 04/29/2020   Acute pain of left shoulder 11/07/2018   History of hematuria 10/07/2018   History of pneumonia 10/07/2018   Healthcare maintenance 01/17/2018   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 [place], [place], [address] Phone: [phone]   Fax:  [phone]  Name: Raymond Stein MRN: [id number] Date of Birth: Jul 28, 1956

## 2021-05-21 ENCOUNTER — Ambulatory Visit: Payer: No Typology Code available for payment source | Admitting: Physical Therapy

## 2021-05-21 ENCOUNTER — Other Ambulatory Visit: Payer: Self-pay

## 2021-05-21 ENCOUNTER — Encounter: Payer: Self-pay | Admitting: Physical Therapy

## 2021-05-21 DIAGNOSIS — R262 Difficulty in walking, not elsewhere classified: Secondary | ICD-10-CM

## 2021-05-21 DIAGNOSIS — M6281 Muscle weakness (generalized): Secondary | ICD-10-CM

## 2021-05-21 DIAGNOSIS — M5441 Lumbago with sciatica, right side: Secondary | ICD-10-CM

## 2021-05-21 DIAGNOSIS — R29898 Other symptoms and signs involving the musculoskeletal system: Secondary | ICD-10-CM

## 2021-05-21 DIAGNOSIS — M5442 Lumbago with sciatica, left side: Secondary | ICD-10-CM

## 2021-05-21 NOTE — Therapy (Signed)
[facility] [address] [place], [place], [address] Phone: [phone]   Fax:  [phone]  Physical Therapy Treatment  Patient Details  Name: Raymond Stein MRN: [id number] Date of Birth: 1955-11-19 Referring Provider (PT): Autumn Patty, MD   Encounter Date: 05/21/2021   PT End of Session - 05/21/21 1551     Visit Number 7    Date for PT Re-Evaluation 05/21/21    PT Start Time 1510    PT Stop Time 1600    PT Time Calculation (min) 50 min    Activity Tolerance Patient limited by pain    Behavior During Therapy [facility] for tasks assessed/performed             History reviewed. No pertinent past medical history.  History reviewed. No pertinent surgical history.  There were no vitals filed for this visit.   Subjective Assessment - 05/21/21 1510     Subjective About a 4, dealing with pain in the same areas, goes to the MD next week    Currently in Pain? Yes    Pain Score 4     Pain Location Back    Pain Orientation Lower                               [facility] Adult PT Treatment/Exercise - 05/21/21 0001       Ambulation/Gait   Gait Comments Outside around back building up and down slope      Lumbar Exercises: Aerobic   Recumbent Bike L2  3 min      Lumbar Exercises: Standing   Row Strengthening;Theraband;20 reps;Both    Theraband Level (Row) Level 3 (Green)    Shoulder Extension Strengthening;20 reps;Both;Power Tower    Theraband Level (Shoulder Extension) Level 3 (Green)    Other Standing Lumbar Exercises Hip Ext 2x10      Lumbar Exercises: Seated   Sit to Stand 10 reps   x2 with UE assist     Electrical Stimulation   Electrical Stimulation Location B lumbar paraspinals    Electrical Stimulation Parameters to tp tolerance    Electrical Stimulation Goals Pain                       PT Short Term Goals - 04/16/21 1601       PT SHORT TERM GOAL #1   Title Patient to be  independent with initial HEP.    Status Achieved               PT Long Term Goals - 05/21/21 1552       PT LONG TERM GOAL #1   Title Patient to be independent with advanced HEP.    Status Achieved      PT LONG TERM GOAL #2   Title Patient to demonstrate lumbar AROM WFL and without pain limiting.    Status On-going      PT LONG TERM GOAL #3   Title Patient to demonstrate B LE strength >/=4+/5.    Status On-going      PT LONG TERM GOAL #4   Title Patient to lift 20lbs from floor to chest with good body mechanics and no pain.    Status On-going                   Plan - 05/21/21 1553     Clinical Impression Statement Pt able to  progress to outdoor ambulation up and down slope despite reports so LBP. Pt reported a slight increase in LBP during the uphill section of gait trial. Pt able to tolerated more reps of sit to stands with UE use, again a slight increase in LBP when standing. Tactile cues needed to prevent anterior leaning with hip extensions.    Personal Factors and Comorbidities Age;Behavior Pattern;Time since onset of injury/illness/exacerbation;Past/Current Experience;Profession    Examination-Activity Limitations Locomotion Level;Transfers;Bed Mobility;Reach Overhead;Bend;Carry;Sleep;Squat;Stairs;Dressing;Hygiene/Grooming;Stand;Lift    Examination-Participation Restrictions [employer];Shop;Driving;Community Activity;Occupation;Cleaning;Church;Meal Prep    Stability/Clinical Decision Making Evolving/Moderate complexity    Rehab Potential Good    PT Frequency 1x / week    PT Duration 6 weeks    PT Treatment/Interventions ADLs/Self Care Home Management;Cryotherapy;Electrical Stimulation;Ultrasound;Traction;Moist Heat;Gait training;Stair training;Functional mobility training;Therapeutic activities;Therapeutic exercise;Balance training;Neuromuscular re-education;Manual techniques;Patient/family education;Passive range of motion;Dry needling;Energy  conservation;Vasopneumatic Device;Taping    PT Next Visit Plan progress gentle lumbar and hip ROM and core/hip strength             Patient will benefit from skilled therapeutic intervention in order to improve the following deficits and impairments:  Decreased range of motion, Difficulty walking, Increased fascial restricitons, Increased muscle spasms, Decreased safety awareness, Decreased activity tolerance, Pain, Decreased balance, Hypomobility, Impaired flexibility, Improper body mechanics, Postural dysfunction, Decreased strength  Visit Diagnosis: Muscle weakness (generalized)  Acute right-sided low back pain with bilateral sciatica  Other symptoms and signs involving the musculoskeletal system  Difficulty in walking, not elsewhere classified     Problem List Patient Active Problem List   Diagnosis Date Noted   Right-sided low back pain without sciatica 04/29/2020   Acute pain of left shoulder 11/07/2018   History of hematuria 10/07/2018   History of pneumonia 10/07/2018   Healthcare maintenance 01/17/2018   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 [place], [place], [address] Phone: [phone]   Fax:  [phone]  Name: Raymond Stein MRN: [id number] Date of Birth: [birth year]

## 2021-05-29 ENCOUNTER — Ambulatory Visit: Payer: No Typology Code available for payment source | Admitting: Physical Therapy

## 2021-05-29 ENCOUNTER — Other Ambulatory Visit: Payer: Self-pay

## 2021-05-29 ENCOUNTER — Encounter: Payer: Self-pay | Admitting: Physical Therapy

## 2021-05-29 DIAGNOSIS — R262 Difficulty in walking, not elsewhere classified: Secondary | ICD-10-CM

## 2021-05-29 DIAGNOSIS — M6281 Muscle weakness (generalized): Secondary | ICD-10-CM

## 2021-05-29 DIAGNOSIS — M5441 Lumbago with sciatica, right side: Secondary | ICD-10-CM

## 2021-05-29 NOTE — Therapy (Signed)
[facility] [address] [place], [place], [address] Phone: [phone]   Fax:  [phone]  Physical Therapy Treatment  Patient Details  Name: Raymond Stein MRN: [id number] Date of Birth: Jan 01, 1956 Referring Provider (PT): Autumn Patty, MD   Encounter Date: 05/29/2021   PT End of Session - 05/29/21 1505     Visit Number 8    Date for PT Re-Evaluation 06/25/21    PT Start Time 1430    PT Stop Time 1515    PT Time Calculation (min) 45 min    Activity Tolerance Patient limited by pain    Behavior During Therapy [facility] for tasks assessed/performed             History reviewed. No pertinent past medical history.  History reviewed. No pertinent surgical history.  There were no vitals filed for this visit.   Subjective Assessment - 05/29/21 1425     Subjective "About the same, has not gotten any worst"    Diagnostic tests 04/29/20 lumbar xray: Degenerative change without acute abnormality.    Currently in Pain? Yes    Pain Score 4     Pain Location Back                [facility] PT Assessment - 05/29/21 0001       AROM   Overall AROM Comments pain with all planes    AROM Assessment Site Lumbar    Lumbar Extension to neutral    Lumbar - Right Side Bend 50% limited    Lumbar - Left Side Bend 50& limited                           [facility] Adult PT Treatment/Exercise - 05/29/21 0001       Lumbar Exercises: Aerobic   Nustep L4 x6 min (UEs/LEs)      Lumbar Exercises: Machines for Strengthening   Other Lumbar Machine Exercise rows 15lb 2x10      Lumbar Exercises: Standing   Row Strengthening;Power tower;Both;20 reps    Row Limitations 10    Shoulder Extension Strengthening;Both;20 reps   5lb   Other Standing Lumbar Exercises 6 inch step ups x5 each      Lumbar Exercises: Seated   Sit to Stand 20 reps   with UE   Other Seated Lumbar Exercises OHP yellow ball 2x10      Electrical  Stimulation   Electrical Stimulation Location B lumbar paraspinals    Electrical Stimulation Parameters to tolerance    Electrical Stimulation Goals Pain                       PT Short Term Goals - 04/16/21 1601       PT SHORT TERM GOAL #1   Title Patient to be independent with initial HEP.    Status Achieved               PT Long Term Goals - 05/29/21 1506       PT LONG TERM GOAL #1   Title Patient to be independent with advanced HEP.    Status Achieved      PT LONG TERM GOAL #2   Title Patient to demonstrate lumbar AROM WFL and without pain limiting.    Status On-going      PT LONG TERM GOAL #3   Title Patient to demonstrate B LE strength >/=4+/5.    Status  On-going      PT LONG TERM GOAL #4   Title Patient to lift 20lbs from floor to chest with good body mechanics and no pain.    Status On-going                   Plan - 05/29/21 1508     Clinical Impression Statement Pt continues to be limited in therapy session by lumbar pain. Pt maintain a rigid stiff posture throughout session. He completed th interventions slow as he anticipated pain with movement. Postural weakness noted with standing rows and shoulder extensions. Pt unable to complete sit to stands without UE assist.    Personal Factors and Comorbidities Age;Behavior Pattern;Time since onset of injury/illness/exacerbation;Past/Current Experience;Profession    Examination-Activity Limitations Locomotion Level;Transfers;Bed Mobility;Reach Overhead;Bend;Carry;Sleep;Squat;Stairs;Dressing;Hygiene/Grooming;Stand;Lift    Examination-Participation Restrictions [employer];Shop;Driving;Community Activity;Occupation;Cleaning;Church;Meal Prep    Stability/Clinical Decision Making Evolving/Moderate complexity    Rehab Potential Good    PT Frequency 1x / week    PT Duration 6 weeks    PT Treatment/Interventions ADLs/Self Care Home Management;Cryotherapy;Electrical  Stimulation;Ultrasound;Traction;Moist Heat;Gait training;Stair training;Functional mobility training;Therapeutic activities;Therapeutic exercise;Balance training;Neuromuscular re-education;Manual techniques;Patient/family education;Passive range of motion;Dry needling;Energy conservation;Vasopneumatic Device;Taping    PT Next Visit Plan progress gentle lumbar and hip ROM and core/hip strength, get report from MD             Patient will benefit from skilled therapeutic intervention in order to improve the following deficits and impairments:  Decreased range of motion, Difficulty walking, Increased fascial restricitons, Increased muscle spasms, Decreased safety awareness, Decreased activity tolerance, Pain, Decreased balance, Hypomobility, Impaired flexibility, Improper body mechanics, Postural dysfunction, Decreased strength  Visit Diagnosis: Difficulty in walking, not elsewhere classified  Acute right-sided low back pain with bilateral sciatica  Muscle weakness (generalized)     Problem List Patient Active Problem List   Diagnosis Date Noted   Right-sided low back pain without sciatica 04/29/2020   Acute pain of left shoulder 11/07/2018   History of hematuria 10/07/2018   History of pneumonia 10/07/2018   Healthcare maintenance 01/17/2018   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 [address] [place], [place], [address] Phone: [phone]   Fax:  [phone]  Name: Raymond Stein MRN: [id number] Date of Birth: [birth year]

## 2021-06-05 ENCOUNTER — Other Ambulatory Visit: Payer: Self-pay

## 2021-06-05 ENCOUNTER — Encounter: Payer: Self-pay | Admitting: Physical Therapy

## 2021-06-05 ENCOUNTER — Ambulatory Visit: Payer: No Typology Code available for payment source | Admitting: Physical Therapy

## 2021-06-05 DIAGNOSIS — M5441 Lumbago with sciatica, right side: Secondary | ICD-10-CM

## 2021-06-05 DIAGNOSIS — R262 Difficulty in walking, not elsewhere classified: Secondary | ICD-10-CM | POA: Diagnosis not present

## 2021-06-05 DIAGNOSIS — R29898 Other symptoms and signs involving the musculoskeletal system: Secondary | ICD-10-CM

## 2021-06-05 DIAGNOSIS — M6281 Muscle weakness (generalized): Secondary | ICD-10-CM

## 2021-06-05 NOTE — Therapy (Signed)
[facility] [address] [place], [place], [address] Phone: [phone]   Fax:  [phone]  Physical Therapy Treatment  Patient Details  Name: Raymond Stein MRN: [id number] Date of Birth: 03/11/1956 Referring Provider (PT): Autumn Patty, MD   Encounter Date: 06/05/2021   PT End of Session - 06/05/21 1433     Visit Number 9    Date for PT Re-Evaluation 06/25/21    Authorization Type [employer]    Activity Tolerance Patient limited by pain    Behavior During Therapy [facility] for tasks assessed/performed             History reviewed. No pertinent past medical history.  History reviewed. No pertinent surgical history.  There were no vitals filed for this visit.   Subjective Assessment - 06/05/21 1350     Subjective "About the same" Went to MD last week, MRI scheduled  for next Thursday    Currently in Pain? Yes    Pain Score 4     Pain Location Back    Pain Orientation Lower                               [facility] Adult PT Treatment/Exercise - 06/05/21 0001       Ambulation/Gait   Gait Pattern Antalgic    Gait Comments Outside around back building up and down slope. Sharp pain in R low back hip area      Lumbar Exercises: Aerobic   Nustep L5  x6 min (UEs/LEs)      Lumbar Exercises: Machines for Strengthening   Cybex Knee Extension 5lb 2x10    Cybex Knee Flexion 20lb 2x10      Lumbar Exercises: Standing   Row Strengthening;Power tower;20 reps;Theraband    Theraband Level (Row) Level 4 (Blue)      Lumbar Exercises: Seated   Sit to Stand 20 reps   x2 with UE use     Electrical Stimulation   Electrical Stimulation Location B lumbar paraspinals    Electrical Stimulation Parameters to tolerance    Electrical Stimulation Goals Pain                       PT Short Term Goals - 04/16/21 1601       PT SHORT TERM GOAL #1   Title Patient to be independent with initial HEP.     Status Achieved               PT Long Term Goals - 05/29/21 1506       PT LONG TERM GOAL #1   Title Patient to be independent with advanced HEP.    Status Achieved      PT LONG TERM GOAL #2   Title Patient to demonstrate lumbar AROM WFL and without pain limiting.    Status On-going      PT LONG TERM GOAL #3   Title Patient to demonstrate B LE strength >/=4+/5.    Status On-going      PT LONG TERM GOAL #4   Title Patient to lift 20lbs from floor to chest with good body mechanics and no pain.    Status On-going                   Plan - 06/05/21 1433     Clinical Impression Statement Pt enters clinic reporting that MD has scheduled an MRI next  Thursday. He continues to report low back pain in R side without relief. stiff rigid posture remains with guarded mobility. Slight antalgic gait noted with outdoor ambulation. An increase in back tight ness with standing rows. Pt able to complete seated leg curls and extensions but required increase time.    Personal Factors and Comorbidities Age;Behavior Pattern;Time since onset of injury/illness/exacerbation;Past/Current Experience;Profession    Examination-Activity Limitations Locomotion Level;Transfers;Bed Mobility;Reach Overhead;Bend;Carry;Sleep;Squat;Stairs;Dressing;Hygiene/Grooming;Stand;Lift    Examination-Participation Restrictions [employer];Shop;Driving;Community Activity;Occupation;Cleaning;Church;Meal Prep    Stability/Clinical Decision Making Evolving/Moderate complexity    Rehab Potential Good    PT Frequency 1x / week    PT Duration 6 weeks    PT Treatment/Interventions ADLs/Self Care Home Management;Cryotherapy;Electrical Stimulation;Ultrasound;Traction;Moist Heat;Gait training;Stair training;Functional mobility training;Therapeutic activities;Therapeutic exercise;Balance training;Neuromuscular re-education;Manual techniques;Patient/family education;Passive range of motion;Dry needling;Energy  conservation;Vasopneumatic Device;Taping    PT Next Visit Plan progress gentle lumbar and hip ROM and core/hip strength             Patient will benefit from skilled therapeutic intervention in order to improve the following deficits and impairments:  Decreased range of motion, Difficulty walking, Increased fascial restricitons, Increased muscle spasms, Decreased safety awareness, Decreased activity tolerance, Pain, Decreased balance, Hypomobility, Impaired flexibility, Improper body mechanics, Postural dysfunction, Decreased strength  Visit Diagnosis: Acute right-sided low back pain with bilateral sciatica  Other symptoms and signs involving the musculoskeletal system  Muscle weakness (generalized)  Difficulty in walking, not elsewhere classified     Problem List Patient Active Problem List   Diagnosis Date Noted   Right-sided low back pain without sciatica 04/29/2020   Acute pain of left shoulder 11/07/2018   History of hematuria 10/07/2018   History of pneumonia 10/07/2018   Healthcare maintenance 01/17/2018   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 [place], [place], [address] Phone: [phone]   Fax:  [phone]  Name: Rease Wence MRN: [id number] Date of Birth: 05-Sep-1956

## 2021-06-12 ENCOUNTER — Other Ambulatory Visit: Payer: Self-pay

## 2021-06-12 ENCOUNTER — Ambulatory Visit: Payer: No Typology Code available for payment source | Attending: Neurological Surgery | Admitting: Physical Therapy

## 2021-06-12 ENCOUNTER — Encounter: Payer: Self-pay | Admitting: Physical Therapy

## 2021-06-12 DIAGNOSIS — R262 Difficulty in walking, not elsewhere classified: Secondary | ICD-10-CM | POA: Diagnosis present

## 2021-06-12 DIAGNOSIS — M6281 Muscle weakness (generalized): Secondary | ICD-10-CM | POA: Diagnosis present

## 2021-06-12 DIAGNOSIS — M5441 Lumbago with sciatica, right side: Secondary | ICD-10-CM | POA: Diagnosis present

## 2021-06-12 DIAGNOSIS — M5442 Lumbago with sciatica, left side: Secondary | ICD-10-CM | POA: Diagnosis not present

## 2021-06-12 DIAGNOSIS — R29898 Other symptoms and signs involving the musculoskeletal system: Secondary | ICD-10-CM | POA: Insufficient documentation

## 2021-06-12 NOTE — Therapy (Signed)
[facility] [address] [place], [place], [address] Phone: [phone]   Fax:  [phone]  Physical Therapy Treatment  Patient Details  Name: Raymond Stein MRN: [id number] Date of Birth: 08-02-1956 Referring Provider (PT): Autumn Patty, MD   Encounter Date: 06/12/2021   PT End of Session - 06/12/21 1510     Visit Number 10    Date for PT Re-Evaluation 06/25/21    Authorization Type Aet[employer]    PT Start Time 1430    PT Stop Time 1520    PT Time Calculation (min) 50 min    Activity Tolerance Patient limited by pain    Behavior During Therapy [facility] for tasks assessed/performed             History reviewed. No pertinent past medical history.  History reviewed. No pertinent surgical history.  There were no vitals filed for this visit.   Subjective Assessment - 06/12/21 1436     Subjective MRi this afternoon. Feeling the same    Currently in Pain? No/denies                [facility] PT Assessment - 06/12/21 0001       AROM   Overall AROM Comments pain with all planes    AROM Assessment Site Lumbar    Lumbar Extension to neutral    Lumbar - Right Side Bend 50% limited    Lumbar - Left Side Bend 50& limited                           [facility] Adult PT Treatment/Exercise - 06/12/21 0001       Lumbar Exercises: Aerobic   Nustep L4  x6 min (UEs/LEs)      Lumbar Exercises: Machines for Strengthening   Cybex Knee Extension 5lb 2x10    Cybex Knee Flexion 20lb 2x15      Lumbar Exercises: Standing   Row Strengthening;Power tower;Both;20 reps    Row Limitations 5      Lumbar Exercises: Seated   Sit to Stand 10 reps   OHP yellow ball x10     Electrical Stimulation   Electrical Stimulation Location B lumbar paraspinals    Electrical Stimulation Parameters to tolerance    Electrical Stimulation Goals Pain                       PT Short Term Goals - 04/16/21 1601       PT SHORT  TERM GOAL #1   Title Patient to be independent with initial HEP.    Status Achieved               PT Long Term Goals - 05/29/21 1506       PT LONG TERM GOAL #1   Title Patient to be independent with advanced HEP.    Status Achieved      PT LONG TERM GOAL #2   Title Patient to demonstrate lumbar AROM WFL and without pain limiting.    Status On-going      PT LONG TERM GOAL #3   Title Patient to demonstrate B LE strength >/=4+/5.    Status On-going      PT LONG TERM GOAL #4   Title Patient to lift 20lbs from floor to chest with good body mechanics and no pain.    Status On-going  Plan - 06/12/21 1510     Clinical Impression Statement Pt continues to be limited by pain. No improvement with lumbar ROM. Pt continues to have slow guarded mobility, increase time needed to complete exercise interventions. Slight antalgic gait remains with outdoor ambulation. Postural weakness present with shoulder extensions. Pt reports that he has a MRI scheduled this afternoon.    Personal Factors and Comorbidities Age;Behavior Pattern;Time since onset of injury/illness/exacerbation;Past/Current Experience;Profession    Examination-Activity Limitations Locomotion Level;Transfers;Bed Mobility;Reach Overhead;Bend;Carry;Sleep;Squat;Stairs;Dressing;Hygiene/Grooming;Stand;Lift    Examination-Participation Restrictions [employer];Shop;Driving;Community Activity;Occupation;Cleaning;Church;Meal Prep    Stability/Clinical Decision Making Evolving/Moderate complexity    Rehab Potential Good    PT Frequency 1x / week    PT Treatment/Interventions ADLs/Self Care Home Management;Cryotherapy;Electrical Stimulation;Ultrasound;Traction;Moist Heat;Gait training;Stair training;Functional mobility training;Therapeutic activities;Therapeutic exercise;Balance training;Neuromuscular re-education;Manual techniques;Patient/family education;Passive range of motion;Dry needling;Energy  conservation;Vasopneumatic Device;Taping    PT Next Visit Plan progress gentle lumbar and hip ROM and core/hip strength, get report from MD             Patient will benefit from skilled therapeutic intervention in order to improve the following deficits and impairments:     Visit Diagnosis: Acute right-sided low back pain with bilateral sciatica  Muscle weakness (generalized)  Difficulty in walking, not elsewhere classified  Other symptoms and signs involving the musculoskeletal system     Problem List Patient Active Problem List   Diagnosis Date Noted   Right-sided low back pain without sciatica 04/29/2020   Acute pain of left shoulder 11/07/2018   History of hematuria 10/07/2018   History of pneumonia 10/07/2018   Healthcare maintenance 01/17/2018   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 [place], [place], [address] Phone: [phone]   Fax:  [phone]  Name: Raymond Stein MRN: [id number] Date of Birth: 31-May-1956

## 2021-06-19 ENCOUNTER — Other Ambulatory Visit: Payer: Self-pay

## 2021-06-19 ENCOUNTER — Ambulatory Visit: Payer: No Typology Code available for payment source | Admitting: Physical Therapy

## 2021-06-19 ENCOUNTER — Encounter: Payer: Self-pay | Admitting: Physical Therapy

## 2021-06-19 DIAGNOSIS — R29898 Other symptoms and signs involving the musculoskeletal system: Secondary | ICD-10-CM

## 2021-06-19 DIAGNOSIS — M5442 Lumbago with sciatica, left side: Secondary | ICD-10-CM | POA: Diagnosis not present

## 2021-06-19 DIAGNOSIS — M5441 Lumbago with sciatica, right side: Secondary | ICD-10-CM

## 2021-06-19 DIAGNOSIS — M6281 Muscle weakness (generalized): Secondary | ICD-10-CM

## 2021-06-19 DIAGNOSIS — R262 Difficulty in walking, not elsewhere classified: Secondary | ICD-10-CM

## 2021-06-19 NOTE — Patient Instructions (Signed)
No changes to HEP 

## 2021-06-19 NOTE — Therapy (Signed)
[facility] [place] [facility] [address] [place], [place], [address] Phone: [phone]   Fax:  [phone]  Physical Therapy Treatment  Patient Details  Name: Raymond Stein MRN: [id number] Date of Birth: 03-29-1956 Referring Provider (PT): Autumn Patty, MD   Encounter Date: 06/19/2021   PT End of Session - 06/19/21 1525     Visit Number 11    PT Start Time 1436    PT Stop Time 1529    PT Time Calculation (min) 53 min    Activity Tolerance Patient limited by pain    Behavior During Therapy [facility] for tasks assessed/performed             History reviewed. No pertinent past medical history.  History reviewed. No pertinent surgical history.  There were no vitals filed for this visit.   Subjective Assessment - 06/19/21 1440     Subjective Has not seen results from MRI. Back pain remains present R>L. He is consistently performing HEP with some progress.    Limitations Lifting;Standing;Walking;House hold activities    How long can you walk comfortably? 30 min    Diagnostic tests 04/29/20 lumbar xray: Degenerative change without acute abnormality.    Patient Stated Goals decrease pain    Currently in Pain? Yes    Pain Score 4     Pain Location Back    Pain Orientation Right    Pain Descriptors / Indicators Numbness;Sharp;Radiating    Pain Type Chronic pain    Pain Radiating Towards into RLE    Pain Onset More than a month ago    Aggravating Factors  standing    Pain Relieving Factors ice, rest                               [facility] Adult PT Treatment/Exercise - 06/19/21 0001       Ambulation/Gait   Gait Pattern Antalgic      Lumbar Exercises: Stretches   Pelvic Tilt 5 reps    Pelvic Tilt Limitations limited mobility with mild pain across low back, mild stretch felt.    Other Lumbar Stretch Exercise U KTC, hold x 30 seconds, 3 x each leg      Lumbar Exercises: Aerobic   Stationary Bike L1.0 x 5 minutes  mild pain in low back with progression.      Lumbar Exercises: Supine   Other Supine Lumbar Exercises manual traction-held x 30 seconds, patient reported slightly increased pain, unable to relax, so further traction defered.      Lumbar Exercises: Sidelying   Clam Right;10 reps      Programme researcher, broadcasting/film/video Location B lumbar paraspinals    Electrical Stimulation Action interferential    Electrical Stimulation Parameters to tolerance    Electrical Stimulation Goals Pain      Manual Therapy   Manual therapy comments Therapist performed piriformis deep tissue mobilization and progressive stretch with active hip IR. Soft tissue mobilization with trigger point pressure.                     PT Education - 06/19/21 1524     Education Details HEP, educated to manual therapy techniques used to attempt to decreased spasm.    Person(s) Educated Patient    Methods Explanation;Demonstration    Comprehension Verbalized understanding              PT Short Term Goals -  06/19/21 1632       PT SHORT TERM GOAL #2   Title Patient will be I with updated HEP    Baseline New exercises given recently    Time 2    Period Weeks    Status New               PT Long Term Goals - 06/19/21 1632       PT LONG TERM GOAL #1   Title Patient to be independent with advanced HEP.    Time 6    Period Weeks    Status On-going      PT LONG TERM GOAL #2   Title Patient to demonstrate lumbar AROM WFL and without pain limiting.    Baseline Continues to have pain, reports it is imporved, but no absent.    Time 6    Period Weeks    Status On-going      PT LONG TERM GOAL #3   Title Patient to demonstrate B LE strength >/=4+/5.    Baseline Limited by pain in back with resistance.    Time 6    Period Weeks    Status On-going                   Plan - 06/19/21 1526     Clinical Impression Statement Pain limits activity. Initiated lower trunk  mobilizations iwth active R lower quadrant ant/post mobility and elevation/depression. Limited ROm, but no c/O pain. Educated PPT in supine iwth mod pain. Performed piriformis mobilizaiton and stretch followed by active hip ER for strength. Also initiated U KTC in supine for low back gentle stretch. All activities caused mild to mod pain, but patien treports that he generally does feel better after stretching. Closed wtih E Stime and MH for pain relief.    Stability/Clinical Decision Making Evolving/Moderate complexity    Clinical Decision Making Moderate    Rehab Potential Good    PT Frequency 1x / week    PT Duration 4 weeks    PT Treatment/Interventions ADLs/Self Care Home Management;Cryotherapy;Electrical Stimulation;Ultrasound;Traction;Moist Heat;Gait training;Stair training;Functional mobility training;Therapeutic activities;Therapeutic exercise;Balance training;Neuromuscular re-education;Manual techniques;Patient/family education;Passive range of motion;Dry needling;Energy conservation;Vasopneumatic Device;Taping    PT Next Visit Plan Pain management, stretch, soft tissue mobilization, strenghtening.    Consulted and Agree with Plan of Care Patient             Patient will benefit from skilled therapeutic intervention in order to improve the following deficits and impairments:  Decreased range of motion, Difficulty walking, Increased fascial restricitons, Increased muscle spasms, Decreased safety awareness, Decreased activity tolerance, Pain, Decreased balance, Hypomobility, Impaired flexibility, Improper body mechanics, Postural dysfunction, Decreased strength  Visit Diagnosis: Acute right-sided low back pain with bilateral sciatica  Muscle weakness (generalized)  Difficulty in walking, not elsewhere classified  Other symptoms and signs involving the musculoskeletal system     Problem List Patient Active Problem List   Diagnosis Date Noted   Right-sided low back pain without  sciatica 04/29/2020   Acute pain of left shoulder 11/07/2018   History of hematuria 10/07/2018   History of pneumonia 10/07/2018   Healthcare maintenance 01/17/2018   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 [place], [place], [address] Phone: [phone]   Fax:  [phone]  Name: Raymond Stein MRN: [id number] Date of Birth: 04-29-1956

## 2021-06-26 ENCOUNTER — Other Ambulatory Visit: Payer: Self-pay

## 2021-06-26 ENCOUNTER — Encounter: Payer: Self-pay | Admitting: Physical Therapy

## 2021-06-26 ENCOUNTER — Ambulatory Visit: Payer: No Typology Code available for payment source | Admitting: Physical Therapy

## 2021-06-26 DIAGNOSIS — M5441 Lumbago with sciatica, right side: Secondary | ICD-10-CM

## 2021-06-26 DIAGNOSIS — M6281 Muscle weakness (generalized): Secondary | ICD-10-CM

## 2021-06-26 DIAGNOSIS — R262 Difficulty in walking, not elsewhere classified: Secondary | ICD-10-CM

## 2021-06-26 DIAGNOSIS — M5442 Lumbago with sciatica, left side: Secondary | ICD-10-CM | POA: Diagnosis not present

## 2021-06-26 NOTE — Therapy (Signed)
[facility] [place] [facility] [address] [place], [place], [address] Phone: [phone]   Fax:  [phone]  Physical Therapy Treatment  Patient Details  Name: Raymond Stein MRN: [id number] Date of Birth: 01-Aug-1956 Referring Provider (PT): Autumn Patty, MD   Encounter Date: 06/26/2021   PT End of Session - 06/26/21 1509     Visit Number 12    Date for PT Re-Evaluation 06/25/21    Authorization Type Aetna    PT Start Time 1430    PT Stop Time 1520    PT Time Calculation (min) 50 min    Activity Tolerance Patient limited by pain    Behavior During Therapy [facility] for tasks assessed/performed             History reviewed. No pertinent past medical history.  History reviewed. No pertinent surgical history.  There were no vitals filed for this visit.   Subjective Assessment - 06/26/21 1432     Subjective Has not heard from MD regarding MRI. Still feels the same    Currently in Pain? Yes    Pain Score 4                                [facility] Adult PT Treatment/Exercise - 06/26/21 0001       Lumbar Exercises: Aerobic   Nustep L5  x6 min (UEs/LEs)      Lumbar Exercises: Standing   Row Strengthening;Power tower;Both;20 reps    Row Limitations 5    Shoulder Extension Strengthening;Both;20 reps    Shoulder Extension Limitations 5    Other Standing Lumbar Exercises 20lb resisted gait 4 way x 3 each      Lumbar Exercises: Seated   Sit to Stand 5 reps   x2 OHP yellow ball     Electrical Stimulation   Electrical Stimulation Location B lumbar paraspinals    Electrical Stimulation Action IFC    Electrical Stimulation Parameters to tolerance    Electrical Stimulation Goals Pain      Manual Therapy   Manual Therapy Soft tissue mobilization    Soft tissue mobilization lumbar para spinales                       PT Short Term Goals - 06/19/21 1632       PT SHORT TERM GOAL #2   Title Patient  will be I with updated HEP    Baseline New exercises given recently    Time 2    Period Weeks    Status New               PT Long Term Goals - 06/19/21 1632       PT LONG TERM GOAL #1   Title Patient to be independent with advanced HEP.    Time 6    Period Weeks    Status On-going      PT LONG TERM GOAL #2   Title Patient to demonstrate lumbar AROM WFL and without pain limiting.    Baseline Continues to have pain, reports it is imporved, but no absent.    Time 6    Period Weeks    Status On-going      PT LONG TERM GOAL #3   Title Patient to demonstrate B LE strength >/=4+/5.    Baseline Limited by pain in back with resistance.    Time 6  Period Weeks    Status On-going                   Plan - 06/26/21 1510     Clinical Impression Statement Pt continues to have slow guarded movement due to low back pain. He reports that he should have his MRI results next week. Lateral thigh burning reported with resisted side steps. Some postural core weakness with standing shoulder extensions and rows. Some difficulty today with sit to stands OHP due to weakness. E-Stim for pain.    Personal Factors and Comorbidities Age;Behavior Pattern;Time since onset of injury/illness/exacerbation;Past/Current Experience;Profession    Examination-Activity Limitations Locomotion Level;Transfers;Bed Mobility;Reach Overhead;Bend;Carry;Sleep;Squat;Stairs;Dressing;Hygiene/Grooming;Stand;Lift    Examination-Participation Restrictions [employer];Shop;Driving;Community Activity;Occupation;Cleaning;Church;Meal Prep    Stability/Clinical Decision Making Evolving/Moderate complexity    Rehab Potential Good    PT Frequency 1x / week    PT Duration 4 weeks    PT Treatment/Interventions ADLs/Self Care Home Management;Cryotherapy;Electrical Stimulation;Ultrasound;Traction;Moist Heat;Gait training;Stair training;Functional mobility training;Therapeutic activities;Therapeutic exercise;Balance  training;Neuromuscular re-education;Manual techniques;Patient/family education;Passive range of motion;Dry needling;Energy conservation;Vasopneumatic Device;Taping    PT Next Visit Plan Pain management, stretch, soft tissue mobilization, strenghtening.             Patient will benefit from skilled therapeutic intervention in order to improve the following deficits and impairments:  Decreased range of motion, Difficulty walking, Increased fascial restricitons, Increased muscle spasms, Decreased safety awareness, Decreased activity tolerance, Pain, Decreased balance, Hypomobility, Impaired flexibility, Improper body mechanics, Postural dysfunction, Decreased strength  Visit Diagnosis: Acute right-sided low back pain with bilateral sciatica  Difficulty in walking, not elsewhere classified  Muscle weakness (generalized)     Problem List Patient Active Problem List   Diagnosis Date Noted   Right-sided low back pain without sciatica 04/29/2020   Acute pain of left shoulder 11/07/2018   History of hematuria 10/07/2018   History of pneumonia 10/07/2018   Healthcare maintenance 01/17/2018   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 [place], [place], [address] Phone: [phone]   Fax:  [phone]  Name: Raymond Stein MRN: [id number] Date of Birth: 24-Jun-1956

## 2021-07-03 ENCOUNTER — Other Ambulatory Visit: Payer: Self-pay

## 2021-07-03 ENCOUNTER — Ambulatory Visit: Payer: No Typology Code available for payment source | Admitting: Physical Therapy

## 2021-07-03 ENCOUNTER — Encounter: Payer: Self-pay | Admitting: Physical Therapy

## 2021-07-03 DIAGNOSIS — R262 Difficulty in walking, not elsewhere classified: Secondary | ICD-10-CM

## 2021-07-03 DIAGNOSIS — M5441 Lumbago with sciatica, right side: Secondary | ICD-10-CM

## 2021-07-03 DIAGNOSIS — M5442 Lumbago with sciatica, left side: Secondary | ICD-10-CM

## 2021-07-03 DIAGNOSIS — M6281 Muscle weakness (generalized): Secondary | ICD-10-CM

## 2021-07-03 NOTE — Therapy (Addendum)
[place]. [place], [place], [address] Phone: [phone]   Fax:  [phone]  Physical Therapy Treatment  Patient Details  Name: Raymond Stein MRN: [id number] Date of Birth: 11-29-1955 Referring Provider (PT): Emelda Brothers, MD   Encounter Date: 07/03/2021   PT End of Session - 07/03/21 1517     Visit Number 13    Date for PT Re-Evaluation 08/03/21    PT Start Time 1430    PT Stop Time 1519    PT Time Calculation (min) 49 min    Activity Tolerance Patient limited by pain    Behavior During Therapy [facility] for tasks assessed/performed             History reviewed. No pertinent past medical history.  History reviewed. No pertinent surgical history.  There were no vitals filed for this visit.   Subjective Assessment - 07/03/21 1433     Subjective MRI reading yesterday, pt reports that's he has a disk hitting nerves on the opposite site of his surgical site. Options to work through pain of have surgery again.    Currently in Pain? Yes    Pain Score 4     Pain Location Back    Pain Orientation Right                [facility] PT Assessment - 07/03/21 0001       Assessment   Medical Diagnosis lumbar radiculopathy; s/p lumbar microdiscectomy    Referring Provider (PT) Emelda Brothers, MD      AROM   Overall AROM Comments pain with all planes    AROM Assessment Site Lumbar    Lumbar Flexion 50% limited    Lumbar Extension 75% limited    Lumbar - Right Side Bend 25% limited    Lumbar - Left Side Bend 25%  limited    Lumbar - Right Rotation 25% limitd    Lumbar - Left Rotation 25% limited                           [facility] Adult PT Treatment/Exercise - 07/03/21 0001       Lumbar Exercises: Aerobic   Nustep L5  x6 min (UEs/LEs)      Lumbar Exercises: Standing   Row Strengthening;Power tower;Both;10 reps;5 reps   10lb 5 resp   Row Limitations 5    Shoulder Extension  Strengthening;Both;20 reps    Shoulder Extension Limitations 5    Other Standing Lumbar Exercises 20lb resisted gait 4 way x 3 each    Other Standing Lumbar Exercises 6 inch step ups x10 each      Lumbar Exercises: Seated   Sit to Stand 10 reps   x2, OHP  yellow ball     Electrical Stimulation   Electrical Stimulation Location B lumbar paraspinals    Electrical Stimulation Action IFC    Electrical Stimulation Parameters to tolerance    Electrical Stimulation Goals Pain                       PT Short Term Goals - 07/03/21 1522       PT SHORT TERM GOAL #1   Title Patient to be independent with initial HEP.    Status Achieved               PT Long Term Goals - 07/03/21 1522       PT LONG  TERM GOAL #1   Title Patient to be independent with advanced HEP.    Status Partially Met      PT LONG TERM GOAL #2   Title Patient to demonstrate lumbar AROM WFL and without pain limiting.    Status On-going      PT LONG TERM GOAL #3   Title Patient to demonstrate B LE strength >/=4+/5.    Status On-going      PT LONG TERM GOAL #4   Title Patient to lift 20lbs from floor to chest with good body mechanics and no pain.    Status On-going                   Plan - 07/03/21 1518     Clinical Impression Statement Pt just returns from receiving MRI results. Pt report that he has a disk pushing on a nerve in his lumbar area. Pt stated MD told him he could work through pain, revive an injection, or have surgery again. Again pt with slow guarded movement throughout session. Attempted to increase resistance with some postural strengthening but pt unable to tolerate. Pt has a small increase in lumbar ROM.    Personal Factors and Comorbidities Age;Behavior Pattern;Time since onset of injury/illness/exacerbation;Past/Current Experience;Profession    Examination-Activity Limitations Locomotion Level;Transfers;Bed Mobility;Reach  Overhead;Bend;Carry;Sleep;Squat;Stairs;Dressing;Hygiene/Grooming;Stand;Lift    Examination-Participation Restrictions [employer];Shop;Driving;Community Activity;Occupation;Cleaning;Church;Meal Prep    Stability/Clinical Decision Making Evolving/Moderate complexity    Rehab Potential Good    PT Frequency 1x / week    PT Duration 4 weeks    PT Treatment/Interventions ADLs/Self Care Home Management;Cryotherapy;Electrical Stimulation;Ultrasound;Traction;Moist Heat;Gait training;Stair training;Functional mobility training;Therapeutic activities;Therapeutic exercise;Balance training;Neuromuscular re-education;Manual techniques;Patient/family education;Passive range of motion;Dry needling;Energy conservation;Vasopneumatic Device;Taping    PT Next Visit Plan Pain management, stretch, soft tissue mobilization, strenghtening.             Patient will benefit from skilled therapeutic intervention in order to improve the following deficits and impairments:  Decreased range of motion, Difficulty walking, Increased fascial restricitons, Increased muscle spasms, Decreased safety awareness, Decreased activity tolerance, Pain, Decreased balance, Hypomobility, Impaired flexibility, Improper body mechanics, Postural dysfunction, Decreased strength  Visit Diagnosis: Muscle weakness (generalized) - Plan: PT plan of care cert/re-cert  Difficulty in walking, not elsewhere classified - Plan: PT plan of care cert/re-cert  Acute right-sided low back pain with bilateral sciatica - Plan: PT plan of care cert/re-cert     Problem List Patient Active Problem List   Diagnosis Date Noted   Right-sided low back pain without sciatica 04/29/2020   Acute pain of left shoulder 11/07/2018   History of hematuria 10/07/2018   History of pneumonia 10/07/2018   Healthcare maintenance 01/17/2018   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 [place], [place], [address] Phone: [phone]   Fax:  [phone]  Name: Raymond Stein MRN: [id number] Date of Birth: Jan 14, 1956

## 2021-07-03 NOTE — Addendum Note (Signed)
Addended by: Jearld Lesch on: 07/03/2021 03:45 PM   Modules accepted: Orders

## 2021-07-10 ENCOUNTER — Other Ambulatory Visit: Payer: Self-pay

## 2021-07-10 ENCOUNTER — Encounter: Payer: Self-pay | Admitting: Physical Therapy

## 2021-07-10 ENCOUNTER — Ambulatory Visit: Payer: No Typology Code available for payment source | Attending: Neurological Surgery | Admitting: Physical Therapy

## 2021-07-10 DIAGNOSIS — R262 Difficulty in walking, not elsewhere classified: Secondary | ICD-10-CM | POA: Diagnosis present

## 2021-07-10 DIAGNOSIS — M5441 Lumbago with sciatica, right side: Secondary | ICD-10-CM | POA: Insufficient documentation

## 2021-07-10 DIAGNOSIS — M6281 Muscle weakness (generalized): Secondary | ICD-10-CM | POA: Diagnosis present

## 2021-07-10 DIAGNOSIS — M5442 Lumbago with sciatica, left side: Secondary | ICD-10-CM | POA: Diagnosis present

## 2021-07-10 DIAGNOSIS — R29898 Other symptoms and signs involving the musculoskeletal system: Secondary | ICD-10-CM | POA: Diagnosis present

## 2021-07-10 NOTE — Therapy (Signed)
[place]. [place], [place], [address] Phone: [phone]   Fax:  [phone]  Physical Therapy Treatment  Patient Details  Name: Raymond Stein MRN: [id number] Date of Birth: 04-05-1956 Referring Provider (PT): Emelda Brothers, MD   Encounter Date: 07/10/2021   PT End of Session - 07/10/21 1513     Visit Number 14    Date for PT Re-Evaluation 08/03/21    Authorization Type [employer]    PT Start Time 1430    PT Stop Time 1523    PT Time Calculation (min) 53 min    Activity Tolerance Patient limited by pain    Behavior During Therapy [facility] for tasks assessed/performed             History reviewed. No pertinent past medical history.  History reviewed. No pertinent surgical history.  There were no vitals filed for this visit.   Subjective Assessment - 07/10/21 1431     Subjective About the same, he has not decided on which treatment option in regards to surgery or injection.    Limitations Lifting;Standing;Walking;House hold activities    Currently in Pain? Yes    Pain Score 4     Pain Location Back                               [facility] Adult PT Treatment/Exercise - 07/10/21 0001       Ambulation/Gait   Gait Comments Gait to back building, one flight of stairs back down outthe door up hill      Lumbar Exercises: Aerobic   Nustep L4  x6 min (UEs/LEs)      Lumbar Exercises: Machines for Strengthening   Other Lumbar Machine Exercise rows 10lb 2x10      Lumbar Exercises: Standing   Other Standing Lumbar Exercises Bilat UE flex with cane 2x10      Modalities   Modalities Electrical Stimulation;Moist Heat      Moist Heat Therapy   Number Minutes Moist Heat 10 Minutes    Moist Heat Location Lumbar Spine      Electrical Stimulation   Electrical Stimulation Location B lumbar paraspinals    Electrical Stimulation Action IFC    Electrical Stimulation Parameters to tolerance     Electrical Stimulation Goals Pain                       PT Short Term Goals - 07/03/21 1522       PT SHORT TERM GOAL #1   Title Patient to be independent with initial HEP.    Status Achieved               PT Long Term Goals - 07/03/21 1522       PT LONG TERM GOAL #1   Title Patient to be independent with advanced HEP.    Status Partially Met      PT LONG TERM GOAL #2   Title Patient to demonstrate lumbar AROM WFL and without pain limiting.    Status On-going      PT LONG TERM GOAL #3   Title Patient to demonstrate B LE strength >/=4+/5.    Status On-going      PT LONG TERM GOAL #4   Title Patient to lift 20lbs from floor to chest with good body mechanics and no pain.    Status On-going  Plan - 07/10/21 1514     Clinical Impression Statement Pt reports no change overall, but stated his pain has not gotten worst. He continues to have rigid guarded mobility. He was very slow with stair negotiation with increase fatigue. He ha snot selected a treatment option from MD. Added seated rows and lats to session but with light resistance. Limited bilat flexion ROM with cane noted. Added MHP to aid in relaxation.    Personal Factors and Comorbidities Age;Behavior Pattern;Time since onset of injury/illness/exacerbation;Past/Current Experience;Profession    Examination-Activity Limitations Locomotion Level;Transfers;Bed Mobility;Reach Overhead;Bend;Carry;Sleep;Squat;Stairs;Dressing;Hygiene/Grooming;Stand;Lift    Examination-Participation Restrictions [employer];Shop;Driving;Community Activity;Occupation;Cleaning;Church;Meal Prep    Stability/Clinical Decision Making Evolving/Moderate complexity    Rehab Potential Good    PT Frequency 1x / week    PT Duration 4 weeks    PT Treatment/Interventions ADLs/Self Care Home Management;Cryotherapy;Electrical Stimulation;Ultrasound;Traction;Moist Heat;Gait training;Stair training;Functional  mobility training;Therapeutic activities;Therapeutic exercise;Balance training;Neuromuscular re-education;Manual techniques;Patient/family education;Passive range of motion;Dry needling;Energy conservation;Vasopneumatic Device;Taping    PT Next Visit Plan Pain management, stretch, soft tissue mobilization, strenghtening.             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 [place], [place], [address] Phone: [phone]   Fax:  [phone]  Name: Carles Florea MRN: [id number] Date of Birth: 1955-12-02

## 2021-07-17 ENCOUNTER — Ambulatory Visit: Payer: No Typology Code available for payment source | Admitting: Physical Therapy

## 2021-07-17 ENCOUNTER — Encounter: Payer: Self-pay | Admitting: Physical Therapy

## 2021-07-17 ENCOUNTER — Other Ambulatory Visit: Payer: Self-pay

## 2021-07-17 DIAGNOSIS — M6281 Muscle weakness (generalized): Secondary | ICD-10-CM | POA: Diagnosis not present

## 2021-07-17 DIAGNOSIS — M5441 Lumbago with sciatica, right side: Secondary | ICD-10-CM

## 2021-07-17 DIAGNOSIS — R29898 Other symptoms and signs involving the musculoskeletal system: Secondary | ICD-10-CM

## 2021-07-17 DIAGNOSIS — R262 Difficulty in walking, not elsewhere classified: Secondary | ICD-10-CM

## 2021-07-17 NOTE — Therapy (Signed)
[place]. [place], [place], [address] Phone: [phone]   Fax:  [phone]  Physical Therapy Treatment  Patient Details  Name: Raymond Stein MRN: [id number] Date of Birth: [birth year] Referring Provider (PT): Emelda Brothers, MD   Encounter Date: 07/17/2021   PT End of Session - 07/17/21 1509     Visit Number 15    Date for PT Re-Evaluation 08/03/21    Authorization Type [employer]    PT Start Time 1427    PT Stop Time 1520    PT Time Calculation (min) 53 min    Activity Tolerance Patient limited by pain    Behavior During Therapy [facility] for tasks assessed/performed             History reviewed. No pertinent past medical history.  History reviewed. No pertinent surgical history.  There were no vitals filed for this visit.   Subjective Assessment - 07/17/21 1429     Subjective "About the same, just want to see how far I can go" Has not contacted MD about treatment plan    Pain Score 4     Pain Location Back                               [facility] Adult PT Treatment/Exercise - 07/17/21 0001       Lumbar Exercises: Stretches   Passive Hamstring Stretch Left;Right;5 reps;10 seconds;20 seconds    Single Knee to Chest Stretch Right;Left;3 reps;10 seconds;20 seconds    Lower Trunk Rotation 3 reps;10 seconds;20 seconds    Piriformis Stretch Left;Right;3 reps;10 seconds      Lumbar Exercises: Aerobic   Nustep L4  x6 min (UEs/LEs)      Lumbar Exercises: Supine   Ab Set 2 seconds;20 reps    Bridge Compliant;20 reps;2 seconds    Other Supine Lumbar Exercises LE on pball K2C, bridges, Oblq x10      Modalities   Modalities Electrical Stimulation;Moist Heat      Moist Heat Therapy   Number Minutes Moist Heat 10 Minutes    Moist Heat Location Lumbar Spine      Electrical Stimulation   Electrical Stimulation Location B lumbar paraspinals    Electrical Stimulation Action IFC     Electrical Stimulation Parameters to tolerance    Electrical Stimulation Goals Pain                       PT Short Term Goals - 07/03/21 1522       PT SHORT TERM GOAL #1   Title Patient to be independent with initial HEP.    Status Achieved               PT Long Term Goals - 07/03/21 1522       PT LONG TERM GOAL #1   Title Patient to be independent with advanced HEP.    Status Partially Met      PT LONG TERM GOAL #2   Title Patient to demonstrate lumbar AROM WFL and without pain limiting.    Status On-going      PT LONG TERM GOAL #3   Title Patient to demonstrate B LE strength >/=4+/5.    Status On-going      PT LONG TERM GOAL #4   Title Patient to lift 20lbs from floor to chest with good body mechanics and no pain.  Status On-going                   Plan - 07/17/21 1510     Clinical Impression Statement Pt continues to report a constant 4/10 in low back that increases at times with activity. Session focused on supine interventions for core strength. Slow guarded movements and little elevation with supine bridges Tactile cue needed for core engagement with ab sets. Pt would often groan in discomfort with bridges and passive stretching. Bilateral HS tightness noted with passive stretches. Modalities used for pain.    Personal Factors and Comorbidities Age;Behavior Pattern;Time since onset of injury/illness/exacerbation;Past/Current Experience;Profession    Examination-Activity Limitations Locomotion Level;Transfers;Bed Mobility;Reach Overhead;Bend;Carry;Sleep;Squat;Stairs;Dressing;Hygiene/Grooming;Stand;Lift    Examination-Participation Restrictions [employer];Shop;Driving;Community Activity;Occupation;Cleaning;Church;Meal Prep    Stability/Clinical Decision Making Evolving/Moderate complexity    Rehab Potential Good    PT Treatment/Interventions ADLs/Self Care Home Management;Cryotherapy;Electrical Stimulation;Ultrasound;Traction;Moist  Heat;Gait training;Stair training;Functional mobility training;Therapeutic activities;Therapeutic exercise;Balance training;Neuromuscular re-education;Manual techniques;Patient/family education;Passive range of motion;Dry needling;Energy conservation;Vasopneumatic Device;Taping    PT Next Visit Plan Pain management, stretch, soft tissue mobilization, strenghtening.             Patient will benefit from skilled therapeutic intervention in order to improve the following deficits and impairments:  Decreased range of motion, Difficulty walking, Increased fascial restricitons, Increased muscle spasms, Decreased safety awareness, Decreased activity tolerance, Pain, Decreased balance, Hypomobility, Impaired flexibility, Improper body mechanics, Postural dysfunction, Decreased strength  Visit Diagnosis: Acute right-sided low back pain with bilateral sciatica  Other symptoms and signs involving the musculoskeletal system  Difficulty in walking, not elsewhere classified  Muscle weakness (generalized)     Problem List Patient Active Problem List   Diagnosis Date Noted   Right-sided low back pain without sciatica 04/29/2020   Acute pain of left shoulder 11/07/2018   History of hematuria 10/07/2018   History of pneumonia 10/07/2018   Healthcare maintenance 01/17/2018   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 [place], [place], [address] Phone: [phone]   Fax:  [phone]  Name: Raymond Stein MRN: [id number] Date of Birth: Nov 10, 1955

## 2021-07-24 ENCOUNTER — Ambulatory Visit: Payer: No Typology Code available for payment source | Admitting: Physical Therapy

## 2021-07-30 ENCOUNTER — Ambulatory Visit: Payer: No Typology Code available for payment source | Admitting: Physical Therapy

## 2021-08-05 ENCOUNTER — Other Ambulatory Visit: Payer: Self-pay | Admitting: Family Medicine

## 2021-08-05 DIAGNOSIS — M25512 Pain in left shoulder: Secondary | ICD-10-CM

## 2021-08-07 ENCOUNTER — Other Ambulatory Visit: Payer: Self-pay

## 2021-08-07 ENCOUNTER — Ambulatory Visit: Payer: No Typology Code available for payment source | Attending: Neurological Surgery | Admitting: Physical Therapy

## 2021-08-07 ENCOUNTER — Encounter: Payer: Self-pay | Admitting: Physical Therapy

## 2021-08-07 DIAGNOSIS — R262 Difficulty in walking, not elsewhere classified: Secondary | ICD-10-CM | POA: Insufficient documentation

## 2021-08-07 DIAGNOSIS — R29898 Other symptoms and signs involving the musculoskeletal system: Secondary | ICD-10-CM | POA: Diagnosis present

## 2021-08-07 DIAGNOSIS — M6281 Muscle weakness (generalized): Secondary | ICD-10-CM | POA: Insufficient documentation

## 2021-08-07 DIAGNOSIS — M5442 Lumbago with sciatica, left side: Secondary | ICD-10-CM | POA: Diagnosis not present

## 2021-08-07 DIAGNOSIS — M5441 Lumbago with sciatica, right side: Secondary | ICD-10-CM | POA: Diagnosis present

## 2021-08-07 NOTE — Therapy (Signed)
[place]. [place], [place], [address] Phone: [phone]   Fax:  [phone]  Physical Therapy Treatment  Patient Details  Name: Raymond Stein MRN: [id number] Date of Birth: September 17, 1955 Referring Provider (PT): Emelda Brothers, MD   Encounter Date: 08/07/2021   PT End of Session - 08/07/21 1513     Visit Number 16    Date for PT Re-Evaluation 09/06/21    PT Start Time 1430    PT Stop Time 1521    PT Time Calculation (min) 51 min    Activity Tolerance Patient limited by pain    Behavior During Therapy [facility] for tasks assessed/performed             History reviewed. No pertinent past medical history.  History reviewed. No pertinent surgical history.  There were no vitals filed for this visit.   Subjective Assessment - 08/07/21 1421     Subjective Pt reports having a bronchitis infection, Coughing a lot, not is having pain on the L side as well. Pt points to L glute and going down leg. Thinks its from coughing    Currently in Pain? Yes    Pain Score 5     Pain Location Back    Pain Orientation Left;Right                [facility] PT Assessment - 08/07/21 0001       Assessment   Medical Diagnosis lumbar radiculopathy; s/p lumbar microdiscectomy    Referring Provider (PT) Emelda Brothers, MD      AROM   Overall AROM Comments pain with all planes    AROM Assessment Site Lumbar    Lumbar Flexion 50% limited    Lumbar Extension 75% limited    Lumbar - Right Side Bend 50% limited    Lumbar - Left Side Bend 50%  limited    Lumbar - Right Rotation 25% limitd    Lumbar - Left Rotation 25% limited      Transfers   Five time sit to stand comments  52.60 sec      Balance   Balance Assessed Yes                           [facility] Adult PT Treatment/Exercise - 08/07/21 0001       Lumbar Exercises: Aerobic   Nustep L4  x6 min (UEs/LEs)      Lumbar Exercises: Machines for  Strengthening   Cybex Knee Extension 5lb 2x10    Cybex Knee Flexion 20lb 2x10      Lumbar Exercises: Standing   Row Strengthening;Both;20 reps;Theraband    Theraband Level (Row) Level 1 (Yellow)    Shoulder Extension Strengthening;Both;20 reps;Theraband    Theraband Level (Shoulder Extension) Level 1 (Yellow)      Modalities   Modalities Electrical Stimulation;Moist Heat      Moist Heat Therapy   Number Minutes Moist Heat 10 Minutes    Moist Heat Location Lumbar Spine      Electrical Stimulation   Electrical Stimulation Location B lumbar paraspinals    Electrical Stimulation Action IFC    Electrical Stimulation Parameters to tolerance    Electrical Stimulation Goals Pain                       PT Short Term Goals - 07/03/21 1522       PT SHORT TERM GOAL #1  Title Patient to be independent with initial HEP.    Status Achieved               PT Long Term Goals - 08/07/21 1444       PT LONG TERM GOAL #1   Title Patient to be independent with advanced HEP.    Status Partially Met      PT LONG TERM GOAL #2   Status On-going      PT LONG TERM GOAL #3   Title Patient to demonstrate B LE strength >/=4+/5.    Status On-going      PT LONG TERM GOAL #4   Title Patient to lift 20lbs from floor to chest with good body mechanics and no pain.    Status On-going                   Plan - 08/07/21 1513     Clinical Impression Statement Pt returns to PT after having bronchitis. He continues to have increase low back pain on the R side and now he is having  pain on the L side as well. Lumbar ROM remains limited and he also requires increase time with 5 tomes sit to stand. Pt is has slow and guarded mobility. He was able to complete some interventions slowly with the anticipation of pain. Pt will benefit form skilled PT serviced to address the aforementioned impairments.    Personal Factors and Comorbidities Age;Behavior Pattern;Time since onset of  injury/illness/exacerbation;Past/Current Experience;Profession    Examination-Activity Limitations Locomotion Level;Transfers;Bed Mobility;Reach Overhead;Bend;Carry;Sleep;Squat;Stairs;Dressing;Hygiene/Grooming;Stand;Lift    Examination-Participation Restrictions [employer];Shop;Driving;Community Activity;Occupation;Cleaning;Church;Meal Prep    Stability/Clinical Decision Making Evolving/Moderate complexity    Rehab Potential Good    PT Frequency 1x / week    PT Treatment/Interventions ADLs/Self Care Home Management;Cryotherapy;Electrical Stimulation;Ultrasound;Traction;Moist Heat;Gait training;Stair training;Functional mobility training;Therapeutic activities;Therapeutic exercise;Balance training;Neuromuscular re-education;Manual techniques;Patient/family education;Passive range of motion;Dry needling;Energy conservation;Vasopneumatic Device;Taping    PT Next Visit Plan Pain management, stretch, soft tissue mobilization, strenghtening.             Patient will benefit from skilled therapeutic intervention in order to improve the following deficits and impairments:  Decreased range of motion, Difficulty walking, Increased fascial restricitons, Increased muscle spasms, Decreased safety awareness, Decreased activity tolerance, Pain, Decreased balance, Hypomobility, Impaired flexibility, Improper body mechanics, Postural dysfunction, Decreased strength  Visit Diagnosis: Acute right-sided low back pain with bilateral sciatica  Muscle weakness (generalized)  Difficulty in walking, not elsewhere classified  Other symptoms and signs involving the musculoskeletal system     Problem List Patient Active Problem List   Diagnosis Date Noted   Right-sided low back pain without sciatica 04/29/2020   Acute pain of left shoulder 11/07/2018   History of hematuria 10/07/2018   History of pneumonia 10/07/2018   Healthcare maintenance 01/17/2018   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 [place], [place], [address] Phone: [phone]   Fax:  [phone]  Name: Deanglo Hissong MRN: [id number] Date of Birth: 06/23/1956

## 2021-08-14 ENCOUNTER — Ambulatory Visit: Payer: No Typology Code available for payment source | Admitting: Physical Therapy

## 2021-08-14 ENCOUNTER — Other Ambulatory Visit: Payer: Self-pay

## 2021-08-14 ENCOUNTER — Encounter: Payer: Self-pay | Admitting: Physical Therapy

## 2021-08-14 DIAGNOSIS — M5442 Lumbago with sciatica, left side: Secondary | ICD-10-CM | POA: Diagnosis not present

## 2021-08-14 DIAGNOSIS — M5441 Lumbago with sciatica, right side: Secondary | ICD-10-CM

## 2021-08-14 DIAGNOSIS — R262 Difficulty in walking, not elsewhere classified: Secondary | ICD-10-CM

## 2021-08-14 DIAGNOSIS — M6281 Muscle weakness (generalized): Secondary | ICD-10-CM

## 2021-08-14 NOTE — Therapy (Signed)
[place]. [place], [place], [address] Phone: [phone]   Fax:  [phone]  Physical Therapy Treatment  Patient Details  Name: Raymond Stein MRN: [id number] Date of Birth: Feb 15, 1956 Referring Provider (PT): Emelda Brothers, MD   Encounter Date: 08/14/2021   PT End of Session - 08/14/21 1509     Visit Number 17    Date for PT Re-Evaluation 09/06/21    Authorization Type [employer]    PT Start Time 1430    PT Stop Time 1510    PT Time Calculation (min) 40 min    Activity Tolerance Patient limited by pain;Patient tolerated treatment well    Behavior During Therapy [facility] for tasks assessed/performed             History reviewed. No pertinent past medical history.  History reviewed. No pertinent surgical history.  There were no vitals filed for this visit.   Subjective Assessment - 08/14/21 1429     Subjective Still about the same but Im better than last week, not as much on the  side.    Currently in Pain? Yes    Pain Score 4     Pain Location Back                               [facility] Adult PT Treatment/Exercise - 08/14/21 0001       Ambulation/Gait   Gait Comments Gait outside around back uilding up and down slope      Lumbar Exercises: Stretches   Passive Hamstring Stretch Left;Right;5 reps;10 seconds;20 seconds    Single Knee to Chest Stretch Right;Left;3 reps;10 seconds;20 seconds    Piriformis Stretch Right;Left;1 rep;10 seconds      Lumbar Exercises: Aerobic   Nustep L4  x6 min (UEs/LEs)      Lumbar Exercises: Seated   Sit to Stand 10 reps      Lumbar Exercises: Supine   Bridge Compliant;10 reps;2 seconds    Other Supine Lumbar Exercises LE on pball K2C, bridges, Oblq x10                       PT Short Term Goals - 07/03/21 1522       PT SHORT TERM GOAL #1   Title Patient to be independent with initial HEP.    Status Achieved                PT Long Term Goals - 08/07/21 1444       PT LONG TERM GOAL #1   Title Patient to be independent with advanced HEP.    Status Partially Met      PT LONG TERM GOAL #2   Status On-going      PT LONG TERM GOAL #3   Title Patient to demonstrate B LE strength >/=4+/5.    Status On-going      PT LONG TERM GOAL #4   Title Patient to lift 20lbs from floor to chest with good body mechanics and no pain.    Status On-going                   Plan - 08/14/21 1510     Clinical Impression Statement Less pain reported down his L side. He did well overall with outdoor ambulation overall but did have a forward flexed posture with up hill ambulation. increase time needed to complete sit  to stand with OHP. Pt did a better job allowing passive stretching with legg guarding. increase pressure reported with double knee to chest stretch    Personal Factors and Comorbidities Age;Behavior Pattern;Time since onset of injury/illness/exacerbation;Past/Current Experience;Profession    Examination-Activity Limitations Locomotion Level;Transfers;Bed Mobility;Reach Overhead;Bend;Carry;Sleep;Squat;Stairs;Dressing;Hygiene/Grooming;Stand;Lift    Examination-Participation Restrictions [employer];Shop;Driving;Community Activity;Occupation;Cleaning;Church;Meal Prep    Stability/Clinical Decision Making Evolving/Moderate complexity    Rehab Potential Good    PT Frequency 1x / week    PT Duration 4 weeks    PT Treatment/Interventions ADLs/Self Care Home Management;Cryotherapy;Electrical Stimulation;Ultrasound;Traction;Moist Heat;Gait training;Stair training;Functional mobility training;Therapeutic activities;Therapeutic exercise;Balance training;Neuromuscular re-education;Manual techniques;Patient/family education;Passive range of motion;Dry needling;Energy conservation;Vasopneumatic Device;Taping    PT Next Visit Plan Pain management, stretch, soft tissue mobilization, strenghtening.              Patient will benefit from skilled therapeutic intervention in order to improve the following deficits and impairments:  Decreased range of motion, Difficulty walking, Increased fascial restricitons, Increased muscle spasms, Decreased safety awareness, Decreased activity tolerance, Pain, Decreased balance, Hypomobility, Impaired flexibility, Improper body mechanics, Postural dysfunction, Decreased strength  Visit Diagnosis: Acute right-sided low back pain with bilateral sciatica  Muscle weakness (generalized)  Difficulty in walking, not elsewhere classified     Problem List Patient Active Problem List   Diagnosis Date Noted   Right-sided low back pain without sciatica 04/29/2020   Acute pain of left shoulder 11/07/2018   History of hematuria 10/07/2018   History of pneumonia 10/07/2018   Healthcare maintenance 01/17/2018   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 [place], [place], [address] Phone: [phone]   Fax:  [phone]  Name: Raymond Stein MRN: [id number] Date of Birth: July 23, 1956

## 2021-08-21 ENCOUNTER — Encounter: Payer: Self-pay | Admitting: Physical Therapy

## 2021-08-21 ENCOUNTER — Ambulatory Visit: Payer: No Typology Code available for payment source | Admitting: Physical Therapy

## 2021-08-21 ENCOUNTER — Other Ambulatory Visit: Payer: Self-pay

## 2021-08-21 DIAGNOSIS — M5442 Lumbago with sciatica, left side: Secondary | ICD-10-CM

## 2021-08-21 DIAGNOSIS — R262 Difficulty in walking, not elsewhere classified: Secondary | ICD-10-CM

## 2021-08-21 DIAGNOSIS — M6281 Muscle weakness (generalized): Secondary | ICD-10-CM

## 2021-08-21 NOTE — Therapy (Signed)
[place]. [place], [place], [address] Phone: [phone]   Fax:  [phone]  Physical Therapy Treatment  Patient Details  Name: Raymond Stein MRN: [id number] Date of Birth: 07-11-1956 Referring Provider (PT): Emelda Brothers, MD   Encounter Date: 08/21/2021   PT End of Session - 08/21/21 1511     Visit Number 18    Date for PT Re-Evaluation 09/06/21    PT Start Time 1421    PT Stop Time 1512    PT Time Calculation (min) 51 min    Activity Tolerance Patient tolerated treatment well    Behavior During Therapy [facility] for tasks assessed/performed             History reviewed. No pertinent past medical history.  History reviewed. No pertinent surgical history.  There were no vitals filed for this visit.   Subjective Assessment - 08/21/21 1422     Subjective Feeling a little better, still at a 4 but it is not going down the leg as much    Currently in Pain? Yes    Pain Score 4     Pain Location Back    Pain Orientation Right                               [facility] Adult PT Treatment/Exercise - 08/21/21 0001       Lumbar Exercises: Stretches   Double Knee to Chest Stretch 2 reps;10 seconds    Lower Trunk Rotation 3 reps;10 seconds;20 seconds    Piriformis Stretch Right;Left;1 rep;10 seconds      Lumbar Exercises: Aerobic   Nustep L4  x6 min (UEs/LEs)      Lumbar Exercises: Machines for Strengthening   Cybex Knee Extension 10lb 2x10    Cybex Knee Flexion 20lb 2x10      Lumbar Exercises: Standing   Row Strengthening;Power tower;Both;20 reps    Row Limitations 10    Shoulder Extension Strengthening;Power Tower;20 reps;Both    Shoulder Extension Limitations 5    Other Standing Lumbar Exercises 6 in step up x10 each    Other Standing Lumbar Exercises Er red 2x10      Lumbar Exercises: Supine   Bridge Compliant;10 reps;2 seconds    Straight Leg Raise 10 reps;2 seconds     Other Supine Lumbar Exercises LE on pball K2C, bridges, Oblq x10                       PT Short Term Goals - 07/03/21 1522       PT SHORT TERM GOAL #1   Title Patient to be independent with initial HEP.    Status Achieved               PT Long Term Goals - 08/07/21 1444       PT LONG TERM GOAL #1   Title Patient to be independent with advanced HEP.    Status Partially Met      PT LONG TERM GOAL #2   Status On-going      PT LONG TERM GOAL #3   Title Patient to demonstrate B LE strength >/=4+/5.    Status On-going      PT LONG TERM GOAL #4   Title Patient to lift 20lbs from floor to chest with good body mechanics and no pain.    Status On-going  Plan - 08/21/21 1512     Clinical Impression Statement Pt continues to reports less pain that goes don his legs. Pt able to tolerated interventions with increase resistance and or reps. Some LE fatigue and weakness noted with supine interventions. Tactile cues for core engagement needed with SLR. Cues to prevent posterior leaning with standing rows.    Personal Factors and Comorbidities Age;Behavior Pattern;Time since onset of injury/illness/exacerbation;Past/Current Experience;Profession    Examination-Activity Limitations Locomotion Level;Transfers;Bed Mobility;Reach Overhead;Bend;Carry;Sleep;Squat;Stairs;Dressing;Hygiene/Grooming;Stand;Lift    Examination-Participation Restrictions [employer];Shop;Driving;Community Activity;Occupation;Cleaning;Church;Meal Prep    Stability/Clinical Decision Making Evolving/Moderate complexity    Rehab Potential Good    PT Frequency 1x / week    PT Duration 4 weeks    PT Treatment/Interventions ADLs/Self Care Home Management;Cryotherapy;Electrical Stimulation;Ultrasound;Traction;Moist Heat;Gait training;Stair training;Functional mobility training;Therapeutic activities;Therapeutic exercise;Balance training;Neuromuscular re-education;Manual  techniques;Patient/family education;Passive range of motion;Dry needling;Energy conservation;Vasopneumatic Device;Taping    PT Next Visit Plan Pain management, stretch, soft tissue mobilization, strenghtening.             Patient will benefit from skilled therapeutic intervention in order to improve the following deficits and impairments:  Decreased range of motion, Difficulty walking, Increased fascial restricitons, Increased muscle spasms, Decreased safety awareness, Decreased activity tolerance, Pain, Decreased balance, Hypomobility, Impaired flexibility, Improper body mechanics, Postural dysfunction, Decreased strength  Visit Diagnosis: Muscle weakness (generalized)  Difficulty in walking, not elsewhere classified  Acute right-sided low back pain with bilateral sciatica     Problem List Patient Active Problem List   Diagnosis Date Noted   Right-sided low back pain without sciatica 04/29/2020   Acute pain of left shoulder 11/07/2018   History of hematuria 10/07/2018   History of pneumonia 10/07/2018   Healthcare maintenance 01/17/2018   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 [place], [place], [address] Phone: [phone]   Fax:  [phone]  Name: Raymond Stein MRN: [id number] Date of Birth: Jun 21, 1956

## 2021-08-28 ENCOUNTER — Ambulatory Visit: Payer: No Typology Code available for payment source | Admitting: Physical Therapy

## 2021-08-28 ENCOUNTER — Encounter: Payer: Self-pay | Admitting: Physical Therapy

## 2021-08-28 ENCOUNTER — Other Ambulatory Visit: Payer: Self-pay

## 2021-08-28 DIAGNOSIS — R262 Difficulty in walking, not elsewhere classified: Secondary | ICD-10-CM

## 2021-08-28 DIAGNOSIS — R29898 Other symptoms and signs involving the musculoskeletal system: Secondary | ICD-10-CM

## 2021-08-28 DIAGNOSIS — M5442 Lumbago with sciatica, left side: Secondary | ICD-10-CM | POA: Diagnosis not present

## 2021-08-28 DIAGNOSIS — M6281 Muscle weakness (generalized): Secondary | ICD-10-CM

## 2021-08-28 DIAGNOSIS — M5441 Lumbago with sciatica, right side: Secondary | ICD-10-CM

## 2021-08-28 NOTE — Therapy (Signed)
[place]. [place], [place], [address] Phone: [phone]   Fax:  [phone]  Physical Therapy Treatment  Patient Details  Name: Raymond Stein MRN: [id number] Date of Birth: 05-02-1956 Referring Provider (PT): Emelda Brothers, MD   Encounter Date: 08/28/2021   PT End of Session - 08/28/21 1514     Visit Number 19    Date for PT Re-Evaluation 09/06/21    Authorization Type [employer]    PT Start Time 1444    PT Stop Time 1531    PT Time Calculation (min) 47 min    Activity Tolerance Patient tolerated treatment well    Behavior During Therapy [facility] for tasks assessed/performed             History reviewed. No pertinent past medical history.  History reviewed. No pertinent surgical history.  There were no vitals filed for this visit.   Subjective Assessment - 08/28/21 1448     Subjective Still about a 4/10 in the back    Currently in Pain? Yes    Pain Score 4     Pain Location Back    Pain Orientation Right;Lower    Aggravating Factors  standing, activity    Pain Relieving Factors rest                               [facility] Adult PT Treatment/Exercise - 08/28/21 0001       Lumbar Exercises: Aerobic   Nustep L5  x6 min (UEs/LEs)      Lumbar Exercises: Machines for Strengthening   Cybex Knee Extension 10lb 2x10    Cybex Knee Flexion 20lb 2x10    Other Lumbar Machine Exercise rows 15 lb 2x10    Other Lumbar Machine Exercise lats 20lb 2x10      Lumbar Exercises: Supine   Other Supine Lumbar Exercises LE on pball K2C, bridges, Oblq x10      Modalities   Modalities Traction      Traction   Type of Traction Lumbar    Max (lbs) 60    Hold Time static    Time 12                       PT Short Term Goals - 07/03/21 1522       PT SHORT TERM GOAL #1   Title Patient to be independent with initial HEP.    Status Achieved               PT Long Term Goals  - 08/28/21 1516       PT LONG TERM GOAL #1   Title Patient to be independent with advanced HEP.    Status Partially Met      PT LONG TERM GOAL #2   Title Patient to demonstrate lumbar AROM WFL and without pain limiting.    Status On-going      PT LONG TERM GOAL #3   Title Patient to demonstrate B LE strength >/=4+/5.    Status On-going      PT LONG TERM GOAL #4   Title Patient to lift 20lbs from floor to chest with good body mechanics and no pain.                   Plan - 08/28/21 1515     Clinical Impression Statement Patient moves very slow and gaurded, he is  weaker than he appears, had difficulty with 15# on the rows and 20# on the lats and asked to back down in wegiht on this.  he is tender in the right low back, I added traction today and explained why we would do this went over anatomy with this.    PT Next Visit Plan see if the traction helps    Consulted and Agree with Plan of Care Patient             Patient will benefit from skilled therapeutic intervention in order to improve the following deficits and impairments:  Decreased range of motion, Difficulty walking, Increased fascial restricitons, Increased muscle spasms, Decreased safety awareness, Decreased activity tolerance, Pain, Decreased balance, Hypomobility, Impaired flexibility, Improper body mechanics, Postural dysfunction, Decreased strength  Visit Diagnosis: Muscle weakness (generalized)  Difficulty in walking, not elsewhere classified  Acute right-sided low back pain with bilateral sciatica  Other symptoms and signs involving the musculoskeletal system     Problem List Patient Active Problem List   Diagnosis Date Noted   Right-sided low back pain without sciatica 04/29/2020   Acute pain of left shoulder 11/07/2018   History of hematuria 10/07/2018   History of pneumonia 10/07/2018   Healthcare maintenance 01/17/2018   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 [place], [place], [address] Phone: [phone]   Fax:  [phone]  Name: Raymond Stein MRN: [id number] Date of Birth: 05/17/1956

## 2021-09-04 ENCOUNTER — Encounter: Payer: Self-pay | Admitting: Physical Therapy

## 2021-09-04 ENCOUNTER — Other Ambulatory Visit: Payer: Self-pay

## 2021-09-04 ENCOUNTER — Ambulatory Visit: Payer: No Typology Code available for payment source | Admitting: Physical Therapy

## 2021-09-04 DIAGNOSIS — R262 Difficulty in walking, not elsewhere classified: Secondary | ICD-10-CM

## 2021-09-04 DIAGNOSIS — M5442 Lumbago with sciatica, left side: Secondary | ICD-10-CM | POA: Diagnosis not present

## 2021-09-04 DIAGNOSIS — R29898 Other symptoms and signs involving the musculoskeletal system: Secondary | ICD-10-CM

## 2021-09-04 DIAGNOSIS — M5441 Lumbago with sciatica, right side: Secondary | ICD-10-CM

## 2021-09-04 DIAGNOSIS — M6281 Muscle weakness (generalized): Secondary | ICD-10-CM

## 2021-09-04 NOTE — Therapy (Signed)
[place]. [place], [place], [address] Phone: [phone]   Fax:  [phone]  Physical Therapy Treatment  Patient Details  Name: Raymond Stein MRN: [id number] Date of Birth: [birth year] Referring Provider (PT): Emelda Brothers, MD   Encounter Date: 09/04/2021   PT End of Session - 09/04/21 1506     Visit Number 20    Date for PT Re-Evaluation 09/06/21    PT Start Time 1430    PT Stop Time 1515    PT Time Calculation (min) 45 min    Activity Tolerance Patient tolerated treatment well    Behavior During Therapy [facility] for tasks assessed/performed             History reviewed. No pertinent past medical history.  History reviewed. No pertinent surgical history.  There were no vitals filed for this visit.   Subjective Assessment - 09/04/21 1432     Subjective About the same    Currently in Pain? Yes    Pain Score 4     Pain Location Back    Pain Orientation Lower                [facility] PT Assessment - 09/04/21 0001       AROM   Lumbar Flexion 50% limited    Lumbar Extension 75% limited    Lumbar - Right Side Bend 50% limited    Lumbar - Left Side Bend 50%  limited    Lumbar - Right Rotation 25% limitd    Lumbar - Left Rotation 25% limited                           [facility] Adult PT Treatment/Exercise - 09/04/21 0001       Lumbar Exercises: Aerobic   Nustep L5  x6 min (UEs/LEs)      Lumbar Exercises: Machines for Strengthening   Other Lumbar Machine Exercise rows 15 lb 2x10    Other Lumbar Machine Exercise lats 20lb 2x10      Lumbar Exercises: Supine   Bridge Compliant;10 reps;2 seconds    Other Supine Lumbar Exercises LE on pball K2C, bridges, Oblq x10      Traction   Type of Traction Lumbar    Max (lbs) 60    Hold Time static    Time 12                       PT Short Term Goals - 07/03/21 1522       PT SHORT TERM GOAL #1   Title Patient to be  independent with initial HEP.    Status Achieved               PT Long Term Goals - 08/28/21 1516       PT LONG TERM GOAL #1   Title Patient to be independent with advanced HEP.    Status Partially Met      PT LONG TERM GOAL #2   Title Patient to demonstrate lumbar AROM WFL and without pain limiting.    Status On-going      PT LONG TERM GOAL #3   Title Patient to demonstrate B LE strength >/=4+/5.    Status On-going      PT LONG TERM GOAL #4   Title Patient to lift 20lbs from floor to chest with good body mechanics and no pain.  Plan - 09/04/21 1509     Clinical Impression Statement Pt continues to have very slow and guarded mobility. Again pt only able to tolerate light resistance with seated rows and lats. Decrease endurance noted with supine bridges. Continues traction form last session.    Personal Factors and Comorbidities Age;Behavior Pattern;Time since onset of injury/illness/exacerbation;Past/Current Experience;Profession    Examination-Activity Limitations Locomotion Level;Transfers;Bed Mobility;Reach Overhead;Bend;Carry;Sleep;Squat;Stairs;Dressing;Hygiene/Grooming;Stand;Lift    Examination-Participation Restrictions [employer];Shop;Driving;Community Activity;Occupation;Cleaning;Church;Meal Prep    Stability/Clinical Decision Making Evolving/Moderate complexity    Rehab Potential Good    PT Frequency 1x / week    PT Duration 4 weeks    PT Treatment/Interventions ADLs/Self Care Home Management;Cryotherapy;Electrical Stimulation;Ultrasound;Traction;Moist Heat;Gait training;Stair training;Functional mobility training;Therapeutic activities;Therapeutic exercise;Balance training;Neuromuscular re-education;Manual techniques;Patient/family education;Passive range of motion;Dry needling;Energy conservation;Vasopneumatic Device;Taping    PT Next Visit Plan continues traction as needed. core strength             Patient will benefit  from skilled therapeutic intervention in order to improve the following deficits and impairments:  Decreased range of motion, Difficulty walking, Increased fascial restricitons, Increased muscle spasms, Decreased safety awareness, Decreased activity tolerance, Pain, Decreased balance, Hypomobility, Impaired flexibility, Improper body mechanics, Postural dysfunction, Decreased strength  Visit Diagnosis: Muscle weakness (generalized)  Acute right-sided low back pain with bilateral sciatica  Other symptoms and signs involving the musculoskeletal system  Difficulty in walking, not elsewhere classified     Problem List Patient Active Problem List   Diagnosis Date Noted   Right-sided low back pain without sciatica 04/29/2020   Acute pain of left shoulder 11/07/2018   History of hematuria 10/07/2018   History of pneumonia 10/07/2018   Healthcare maintenance 01/17/2018   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 [place], [place], [address] Phone: [phone]   Fax:  [phone]  Name: Raymond Stein MRN: [id number] Date of Birth: [birth year]

## 2021-09-11 ENCOUNTER — Other Ambulatory Visit: Payer: Self-pay

## 2021-09-11 ENCOUNTER — Ambulatory Visit: Payer: No Typology Code available for payment source | Attending: Neurological Surgery | Admitting: Physical Therapy

## 2021-09-11 ENCOUNTER — Encounter: Payer: Self-pay | Admitting: Physical Therapy

## 2021-09-11 DIAGNOSIS — R262 Difficulty in walking, not elsewhere classified: Secondary | ICD-10-CM | POA: Diagnosis present

## 2021-09-11 DIAGNOSIS — R29898 Other symptoms and signs involving the musculoskeletal system: Secondary | ICD-10-CM | POA: Diagnosis present

## 2021-09-11 DIAGNOSIS — M6281 Muscle weakness (generalized): Secondary | ICD-10-CM | POA: Diagnosis present

## 2021-09-11 DIAGNOSIS — M5442 Lumbago with sciatica, left side: Secondary | ICD-10-CM | POA: Diagnosis not present

## 2021-09-11 DIAGNOSIS — M5441 Lumbago with sciatica, right side: Secondary | ICD-10-CM | POA: Insufficient documentation

## 2021-09-11 NOTE — Therapy (Signed)
[facility] [address]. [place], [place], [address] Phone: [phone]   Fax:  [phone]  Physical Therapy Treatment  Patient Details  Name: Raymond Stein MRN: [id number] Date of Birth: [birth year] Referring Provider (PT): Autumn Patty, MD   Encounter Date: 09/11/2021   PT End of Session - 09/11/21 1455     Visit Number 21    PT Start Time 1415    PT Stop Time 1505    PT Time Calculation (min) 50 min             History reviewed. No pertinent past medical history.  History reviewed. No pertinent surgical history.  There were no vitals filed for this visit.   Subjective Assessment - 09/11/21 1417     Subjective Took a pain pill today, it ha snot gotten any worst    Currently in Pain? Yes    Pain Score 4     Pain Location Back                [facility] PT Assessment - 09/11/21 0001       AROM   Lumbar Flexion 50% limited    Lumbar Extension 55% limited    Lumbar - Right Side Bend 50% limited    Lumbar - Left Side Bend 50%  limited    Lumbar - Right Rotation 25% limitd    Lumbar - Left Rotation 25% limited                           [facility] Adult PT Treatment/Exercise - 09/11/21 0001       Ambulation/Gait   Gait Comments Gait outside around back uilding up and down slope      Lumbar Exercises: Aerobic   Nustep L5  x6 min (UEs/LEs)      Lumbar Exercises: Machines for Strengthening   Cybex Knee Extension 10lb 2x10    Cybex Knee Flexion 25lb 2x10      Lumbar Exercises: Standing   Shoulder Extension Strengthening;Both;20 reps;Power Chief of Staff Limitations 10      Traction   Type of Traction Lumbar    Max (lbs) 75    Time 12                       PT Short Term Goals - 07/03/21 1522       PT SHORT TERM GOAL #1   Title Patient to be independent with initial HEP.    Status Achieved               PT Long Term Goals - 09/11/21 1457        PT LONG TERM GOAL #1   Title Patient to be independent with advanced HEP.    Status Achieved      PT LONG TERM GOAL #2   Title Patient to demonstrate lumbar AROM WFL and without pain limiting.    Status On-going      PT LONG TERM GOAL #3   Title Patient to demonstrate B LE strength >/=4+/5.    Status On-going      PT LONG TERM GOAL #4   Title Patient to lift 20lbs from floor to chest with good body mechanics and no pain.    Status On-going                   Plan - 09/11/21 1457  Clinical Impression Statement Pt has progressed increasing his lumbar ext ROM. Pt appears to have less guarded mobility. Hr still has not mad a decision to have another injection or to have surgery again. He reports no decline in his mobility but not much improvement. he continues to report a constant 4/ 10  pain overall.    Personal Factors and Comorbidities Age;Behavior Pattern;Time since onset of injury/illness/exacerbation;Past/Current Experience;Profession    Examination-Activity Limitations Locomotion Level;Transfers;Bed Mobility;Reach Overhead;Bend;Carry;Sleep;Squat;Stairs;Dressing;Hygiene/Grooming;Stand;Lift    Examination-Participation Restrictions Raymond Stein;Shop;Driving;Community Activity;Occupation;Cleaning;Church;Meal Prep    Stability/Clinical Decision Making Evolving/Moderate complexity    Rehab Potential Good    PT Frequency 1x / week    PT Duration 4 weeks    PT Treatment/Interventions ADLs/Self Care Home Management;Cryotherapy;Electrical Stimulation;Ultrasound;Traction;Moist Heat;Gait training;Stair training;Functional mobility training;Therapeutic activities;Therapeutic exercise;Balance training;Neuromuscular re-education;Manual techniques;Patient/family education;Passive range of motion;Dry needling;Energy conservation;Vasopneumatic Device;Taping    PT Next Visit Plan continues traction as needed. core strength             Patient will benefit from skilled  therapeutic intervention in order to improve the following deficits and impairments:  Decreased range of motion, Difficulty walking, Increased fascial restricitons, Increased muscle spasms, Decreased safety awareness, Decreased activity tolerance, Pain, Decreased balance, Hypomobility, Impaired flexibility, Improper body mechanics, Postural dysfunction, Decreased strength  Visit Diagnosis: Acute right-sided low back pain with bilateral sciatica  Other symptoms and signs involving the musculoskeletal system  Difficulty in walking, not elsewhere classified  Muscle weakness (generalized)     Problem List Patient Active Problem List   Diagnosis Date Noted   Right-sided low back pain without sciatica 04/29/2020   Acute pain of left shoulder 11/07/2018   History of hematuria 10/07/2018   History of pneumonia 10/07/2018   Healthcare maintenance 01/17/2018   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 [place], [place], [address] Phone: [phone]   Fax:  [phone]  Name: Raymond Stein MRN: [id number] Date of Birth: Dec 14, 1955

## 2021-09-18 ENCOUNTER — Other Ambulatory Visit: Payer: Self-pay

## 2021-09-18 ENCOUNTER — Ambulatory Visit: Payer: No Typology Code available for payment source | Admitting: Physical Therapy

## 2021-09-18 ENCOUNTER — Encounter: Payer: Self-pay | Admitting: Physical Therapy

## 2021-09-18 DIAGNOSIS — M5441 Lumbago with sciatica, right side: Secondary | ICD-10-CM

## 2021-09-18 DIAGNOSIS — M5442 Lumbago with sciatica, left side: Secondary | ICD-10-CM

## 2021-09-18 DIAGNOSIS — M6281 Muscle weakness (generalized): Secondary | ICD-10-CM

## 2021-09-18 DIAGNOSIS — R29898 Other symptoms and signs involving the musculoskeletal system: Secondary | ICD-10-CM

## 2021-09-18 DIAGNOSIS — R262 Difficulty in walking, not elsewhere classified: Secondary | ICD-10-CM

## 2021-09-18 NOTE — Therapy (Signed)
[place]. [place], [place], [address] Phone: [phone]   Fax:  [phone]  Physical Therapy Treatment  Patient Details  Name: Raymond Stein MRN: [id number] Date of Birth: Sep 06, 1956 Referring Provider (PT): Emelda Brothers, MD   Encounter Date: 09/18/2021   PT End of Session - 09/18/21 1420     Visit Number 22    Date for PT Re-Evaluation 11/09/21    Authorization Type [employer]    PT Start Time 1345    PT Stop Time 1430    PT Time Calculation (min) 45 min    Activity Tolerance Patient tolerated treatment well    Behavior During Therapy [facility] for tasks assessed/performed             History reviewed. No pertinent past medical history.  History reviewed. No pertinent surgical history.  There were no vitals filed for this visit.   Subjective Assessment - 09/18/21 1348     Subjective Had a rough night, think he may have laid wrong. Some tingling down the R side    Currently in Pain? Yes    Pain Score 4     Pain Location Back                               [facility] Adult PT Treatment/Exercise - 09/18/21 0001       Lumbar Exercises: Aerobic   Nustep L5  x6 min (UEs/LEs)      Lumbar Exercises: Machines for Strengthening   Cybex Knee Extension 5lb 2x12    Cybex Knee Flexion 25lb 2x10      Lumbar Exercises: Standing   Row Strengthening;Power tower;Both;20 reps    Row Limitations 10      Lumbar Exercises: Seated   Sit to Stand 10 reps   x2 OHP yellow ball     Traction   Type of Traction Lumbar    Max (lbs) 75    Hold Time static    Time 12                       PT Short Term Goals - 09/18/21 1423       PT SHORT TERM GOAL #2   Title Patient will be I with updated HEP    Status Achieved               PT Long Term Goals - 09/18/21 1423       PT LONG TERM GOAL #1   Title Patient to be independent with advanced HEP.    Status Achieved      PT  LONG TERM GOAL #2   Title Patient to demonstrate lumbar AROM WFL and without pain limiting.    Status On-going                   Plan - 09/18/21 1420     Clinical Impression Statement Pt enters reporting a slight increase in his pain and symptoms. Pt reporting pain in the R low back with tingling going down the R leg. Pt suspected that he slept wrong last night. Fatigue noted today with sit to stands. Pt did required less weight to complete two full sets of LE extensions. Continued with traction to aid with the increase in symptoms..    Personal Factors and Comorbidities Age;Behavior Pattern;Time since onset of injury/illness/exacerbation;Past/Current Experience;Profession    Examination-Activity Limitations Locomotion Level;Transfers;Bed Mobility;Reach  Overhead;Bend;Carry;Sleep;Squat;Stairs;Dressing;Hygiene/Grooming;Stand;Lift    Examination-Participation Restrictions [employer];Shop;Driving;Community Activity;Occupation;Cleaning;Church;Meal Prep    Stability/Clinical Decision Making Evolving/Moderate complexity    Rehab Potential Good    PT Frequency 1x / week    PT Duration 4 weeks    PT Treatment/Interventions ADLs/Self Care Home Management;Cryotherapy;Electrical Stimulation;Ultrasound;Traction;Moist Heat;Gait training;Stair training;Functional mobility training;Therapeutic activities;Therapeutic exercise;Balance training;Neuromuscular re-education;Manual techniques;Patient/family education;Passive range of motion;Dry needling;Energy conservation;Vasopneumatic Device;Taping    PT Next Visit Plan continues traction as needed. core strength             Patient will benefit from skilled therapeutic intervention in order to improve the following deficits and impairments:  Decreased range of motion, Difficulty walking, Increased fascial restricitons, Increased muscle spasms, Decreased safety awareness, Decreased activity tolerance, Pain, Decreased balance, Hypomobility,  Impaired flexibility, Improper body mechanics, Postural dysfunction, Decreased strength  Visit Diagnosis: Acute right-sided low back pain with bilateral sciatica  Muscle weakness (generalized)  Other symptoms and signs involving the musculoskeletal system  Difficulty in walking, not elsewhere classified     Problem List Patient Active Problem List   Diagnosis Date Noted   Right-sided low back pain without sciatica 04/29/2020   Acute pain of left shoulder 11/07/2018   History of hematuria 10/07/2018   History of pneumonia 10/07/2018   Healthcare maintenance 01/17/2018   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 [place], [place], [address] Phone: [phone]   Fax:  [phone]  Name: Keigen Plaut MRN: [id number] Date of Birth: [birth year]

## 2021-09-25 ENCOUNTER — Ambulatory Visit: Payer: No Typology Code available for payment source | Admitting: Physical Therapy

## 2021-09-25 ENCOUNTER — Other Ambulatory Visit: Payer: Self-pay

## 2021-09-25 ENCOUNTER — Encounter: Payer: Self-pay | Admitting: Physical Therapy

## 2021-09-25 DIAGNOSIS — M6281 Muscle weakness (generalized): Secondary | ICD-10-CM

## 2021-09-25 DIAGNOSIS — M5442 Lumbago with sciatica, left side: Secondary | ICD-10-CM | POA: Diagnosis not present

## 2021-09-25 DIAGNOSIS — R29898 Other symptoms and signs involving the musculoskeletal system: Secondary | ICD-10-CM

## 2021-09-25 DIAGNOSIS — M5441 Lumbago with sciatica, right side: Secondary | ICD-10-CM

## 2021-09-25 DIAGNOSIS — R262 Difficulty in walking, not elsewhere classified: Secondary | ICD-10-CM

## 2021-09-25 NOTE — Therapy (Signed)
[place]. [place], [place], [address] Phone: [phone]   Fax:  [phone]  Physical Therapy Treatment  Patient Details  Name: Raymond Stein MRN: [id number] Date of Birth: 1956-07-29 Referring Provider (PT): Emelda Brothers, MD   Encounter Date: 09/25/2021   PT End of Session - 09/25/21 1511     Date for PT Re-Evaluation 11/09/21    Authorization Type [employer]    PT Start Time 1427    PT Stop Time 1512    PT Time Calculation (min) 45 min    Activity Tolerance Patient tolerated treatment well    Behavior During Therapy [facility] for tasks assessed/performed             History reviewed. No pertinent past medical history.  History reviewed. No pertinent surgical history.  There were no vitals filed for this visit.   Subjective Assessment - 09/25/21 1428     Subjective "Still about the same" Some days are worst than others    Currently in Pain? Yes    Pain Score 4     Pain Location Back                               [facility] Adult PT Treatment/Exercise - 09/25/21 0001       Lumbar Exercises: Stretches   Passive Hamstring Stretch Left;Right;5 reps;10 seconds;20 seconds    Single Knee to Chest Stretch Right;Left;3 reps;10 seconds;20 seconds    Piriformis Stretch Left;Right;20 seconds;10 seconds      Lumbar Exercises: Machines for Strengthening   Other Lumbar Machine Exercise rows 15 lb 2x10    Other Lumbar Machine Exercise lats 20lb 2x10      Lumbar Exercises: Seated   Sit to Stand 10 reps   x2 holding yellow ball     Lumbar Exercises: Supine   Bridge Compliant;20 reps;2 seconds    Other Supine Lumbar Exercises LE on pball K2C, bridges, Oblq x10                       PT Short Term Goals - 09/18/21 1423       PT SHORT TERM GOAL #2   Title Patient will be I with updated HEP    Status Achieved               PT Long Term Goals - 09/18/21 1423       PT  LONG TERM GOAL #1   Title Patient to be independent with advanced HEP.    Status Achieved      PT LONG TERM GOAL #2   Title Patient to demonstrate lumbar AROM WFL and without pain limiting.    Status On-going                   Plan - 09/25/21 1512     Clinical Impression Statement Pt continues to have 4/10 low back pain. Slow guarded mobility noted throughout session. PT able to complete interventions but very slow reps. Bilateral LE tightness noted with passive stretching. Some grimacing with L single knee to chest and HS curls on R side    Personal Factors and Comorbidities Age;Behavior Pattern;Time since onset of injury/illness/exacerbation;Past/Current Experience;Profession    Examination-Activity Limitations Locomotion Level;Transfers;Bed Mobility;Reach Overhead;Bend;Carry;Sleep;Squat;Stairs;Dressing;Hygiene/Grooming;Stand;Lift    Examination-Participation Restrictions [employer];Shop;Driving;Community Activity;Occupation;Cleaning;Church;Meal Prep    Stability/Clinical Decision Making Evolving/Moderate complexity    Rehab Potential Good  PT Frequency 1x / week    PT Duration 4 weeks    PT Treatment/Interventions ADLs/Self Care Home Management;Cryotherapy;Electrical Stimulation;Ultrasound;Traction;Moist Heat;Gait training;Stair training;Functional mobility training;Therapeutic activities;Therapeutic exercise;Balance training;Neuromuscular re-education;Manual techniques;Patient/family education;Passive range of motion;Dry needling;Energy conservation;Vasopneumatic Device;Taping    PT Next Visit Plan continues traction as needed. core strength             Patient will benefit from skilled therapeutic intervention in order to improve the following deficits and impairments:  Decreased range of motion, Difficulty walking, Increased fascial restricitons, Increased muscle spasms, Decreased safety awareness, Decreased activity tolerance, Pain, Decreased balance,  Hypomobility, Impaired flexibility, Improper body mechanics, Postural dysfunction, Decreased strength  Visit Diagnosis: Muscle weakness (generalized)  Difficulty in walking, not elsewhere classified  Other symptoms and signs involving the musculoskeletal system  Acute right-sided low back pain with bilateral sciatica     Problem List Patient Active Problem List   Diagnosis Date Noted   Right-sided low back pain without sciatica 04/29/2020   Acute pain of left shoulder 11/07/2018   History of hematuria 10/07/2018   History of pneumonia 10/07/2018   Healthcare maintenance 01/17/2018   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 [place], [place], [address] Phone: [phone]   Fax:  [phone]  Name: Raymond Stein MRN: [id number] Date of Birth: 1955-09-14

## 2021-10-02 ENCOUNTER — Other Ambulatory Visit: Payer: Self-pay | Admitting: Family Medicine

## 2021-10-02 ENCOUNTER — Other Ambulatory Visit: Payer: Self-pay

## 2021-10-02 ENCOUNTER — Encounter: Payer: Self-pay | Admitting: Physical Therapy

## 2021-10-02 ENCOUNTER — Ambulatory Visit: Payer: No Typology Code available for payment source | Admitting: Physical Therapy

## 2021-10-02 DIAGNOSIS — M5441 Lumbago with sciatica, right side: Secondary | ICD-10-CM

## 2021-10-02 DIAGNOSIS — M25512 Pain in left shoulder: Secondary | ICD-10-CM

## 2021-10-02 DIAGNOSIS — R262 Difficulty in walking, not elsewhere classified: Secondary | ICD-10-CM

## 2021-10-02 DIAGNOSIS — R29898 Other symptoms and signs involving the musculoskeletal system: Secondary | ICD-10-CM

## 2021-10-02 DIAGNOSIS — M5442 Lumbago with sciatica, left side: Secondary | ICD-10-CM | POA: Diagnosis not present

## 2021-10-02 DIAGNOSIS — M6281 Muscle weakness (generalized): Secondary | ICD-10-CM

## 2021-10-02 NOTE — Therapy (Signed)
[place]. [place], [place], [address] Phone: [phone]   Fax:  [phone]  Physical Therapy Treatment  Patient Details  Name: Raymond Stein MRN: [id number] Date of Birth: [birth year] Referring Provider (PT): Emelda Brothers, MD   Encounter Date: 10/02/2021   PT End of Session - 10/02/21 1641     Visit Number 23    Date for PT Re-Evaluation 11/09/21    PT Start Time 1600    PT Stop Time 1645    PT Time Calculation (min) 45 min    Activity Tolerance Patient tolerated treatment well    Behavior During Therapy [facility] for tasks assessed/performed             History reviewed. No pertinent past medical history.  History reviewed. No pertinent surgical history.  There were no vitals filed for this visit.   Subjective Assessment - 10/02/21 1600     Subjective Still having pain in low back on the R side. Reports that he need to make a decision about revieving an injection or surgery    Currently in Pain? Yes    Pain Score 4     Pain Location Back                               [facility] Adult PT Treatment/Exercise - 10/02/21 0001       Lumbar Exercises: Stretches   Passive Hamstring Stretch Left;Right;5 reps;10 seconds;2 reps      Lumbar Exercises: Aerobic   Nustep L5  x6 min (UEs/LEs)      Lumbar Exercises: Machines for Strengthening   Cybex Knee Extension 5lb 2x12    Cybex Knee Flexion 20lb 2x10      Lumbar Exercises: Seated   Sit to Stand 10 reps   x2, OHP yellow ball   Other Seated Lumbar Exercises Shoulder ER red 2x10, Horiz abd red 2x10      Lumbar Exercises: Supine   Bridge Compliant;20 reps;2 seconds    Straight Leg Raise 10 reps;2 seconds                       PT Short Term Goals - 10/02/21 1641       PT SHORT TERM GOAL #1   Title Patient to be independent with initial HEP.    Status Achieved      PT SHORT TERM GOAL #2   Title Patient will be I  with updated HEP    Status Achieved               PT Long Term Goals - 10/02/21 1641       PT LONG TERM GOAL #1   Title Patient to be independent with advanced HEP.    Status Achieved      PT LONG TERM GOAL #2   Title Patient to demonstrate lumbar AROM WFL and without pain limiting.    Status On-going      PT LONG TERM GOAL #3   Title Patient to demonstrate B LE strength >/=4+/5.    Status On-going      PT LONG TERM GOAL #4   Title Patient to lift 20lbs from floor to chest with good body mechanics and no pain.    Status On-going                   Plan - 10/02/21 1642  Clinical Impression Statement Slow guarded mobility through clinic. He expressed that he needed to make a decision about having surgery again or to try an injection. Light resistance tolerated with seated curls and extensions. Some low back discomfort reported with sit to stand OHP. Added seated postural stability without issue. bilateral HS tightness    Personal Factors and Comorbidities Age;Behavior Pattern;Time since onset of injury/illness/exacerbation;Past/Current Experience;Profession    Examination-Activity Limitations Locomotion Level;Transfers;Bed Mobility;Reach Overhead;Bend;Carry;Sleep;Squat;Stairs;Dressing;Hygiene/Grooming;Stand;Lift    Examination-Participation Restrictions [employer];Shop;Driving;Community Activity;Occupation;Cleaning;Church;Meal Prep    Stability/Clinical Decision Making Evolving/Moderate complexity    Rehab Potential Good    PT Frequency 1x / week    PT Duration 4 weeks    PT Treatment/Interventions ADLs/Self Care Home Management;Cryotherapy;Electrical Stimulation;Ultrasound;Traction;Moist Heat;Gait training;Stair training;Functional mobility training;Therapeutic activities;Therapeutic exercise;Balance training;Neuromuscular re-education;Manual techniques;Patient/family education;Passive range of motion;Dry needling;Energy conservation;Vasopneumatic  Device;Taping    PT Next Visit Plan continues traction as needed. core strength             Patient will benefit from skilled therapeutic intervention in order to improve the following deficits and impairments:  Decreased range of motion, Difficulty walking, Increased fascial restricitons, Increased muscle spasms, Decreased safety awareness, Decreased activity tolerance, Pain, Decreased balance, Hypomobility, Impaired flexibility, Improper body mechanics, Postural dysfunction, Decreased strength  Visit Diagnosis: Muscle weakness (generalized)  Difficulty in walking, not elsewhere classified  Other symptoms and signs involving the musculoskeletal system  Acute right-sided low back pain with bilateral sciatica     Problem List Patient Active Problem List   Diagnosis Date Noted   Right-sided low back pain without sciatica 04/29/2020   Acute pain of left shoulder 11/07/2018   History of hematuria 10/07/2018   History of pneumonia 10/07/2018   Healthcare maintenance 01/17/2018   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 [place], [place], [address] Phone: [phone]   Fax:  [phone]  Name: Raymond Stein MRN: [id number] Date of Birth: 10/09/1955

## 2021-10-09 ENCOUNTER — Ambulatory Visit: Payer: No Typology Code available for payment source | Attending: Neurological Surgery | Admitting: Physical Therapy

## 2021-10-09 ENCOUNTER — Other Ambulatory Visit: Payer: Self-pay

## 2021-10-09 DIAGNOSIS — R262 Difficulty in walking, not elsewhere classified: Secondary | ICD-10-CM

## 2021-10-09 DIAGNOSIS — M5441 Lumbago with sciatica, right side: Secondary | ICD-10-CM

## 2021-10-09 DIAGNOSIS — M6281 Muscle weakness (generalized): Secondary | ICD-10-CM | POA: Diagnosis not present

## 2021-10-09 DIAGNOSIS — M5442 Lumbago with sciatica, left side: Secondary | ICD-10-CM | POA: Insufficient documentation

## 2021-10-09 NOTE — Therapy (Signed)
[place]. [place], [place], [address] Phone: [phone]   Fax:  [phone]  Physical Therapy Treatment  Patient Details  Name: Raymond Stein MRN: [id number] Date of Birth: [birth year] Referring Provider (PT): Emelda Brothers, MD   Encounter Date: 10/09/2021   PT End of Session - 10/09/21 1437     Visit Number 24    Date for PT Re-Evaluation 11/09/21    Authorization Type [employer]et[employer]    PT Start Time 1400    PT Stop Time 1450    PT Time Calculation (min) 50 min             No past medical history on file.  No past surgical history on file.  There were no vitals filed for this visit.   Subjective Assessment - 10/09/21 1402     Subjective LBP RT > Left. No real overall changes    Currently in Pain? Yes    Pain Score 4     Pain Location Back                               [facility] Adult PT Treatment/Exercise - 10/09/21 0001       Lumbar Exercises: Aerobic   UBE (Upper Arm Bike) L 2 2 min fwd/2 min back standing      Lumbar Exercises: Machines for Strengthening   Other Lumbar Machine Exercise 20# upright row into back ext 10x    Other Lumbar Machine Exercise cable pulleys row and shld ext 2 sets 10      Lumbar Exercises: Standing   Other Standing Lumbar Exercises 6 in step up x10 each   fwd with opp leg ext and laterally with opp leg abd     Lumbar Exercises: Supine   Other Supine Lumbar Exercises core stab 12 min- various ex to activate core      Manual Therapy   Manual Therapy Passive ROM    Passive ROM BIL LE\                       PT Short Term Goals - 10/02/21 1641       PT SHORT TERM GOAL #1   Title Patient to be independent with initial HEP.    Status Achieved      PT SHORT TERM GOAL #2   Title Patient will be I with updated HEP    Status Achieved               PT Long Term Goals - 10/09/21 1436       PT LONG TERM GOAL #1   Title Patient  to be independent with advanced HEP.    Status Achieved      PT LONG TERM GOAL #2   Title Patient to demonstrate lumbar AROM WFL and without pain limiting.    Status On-going      PT LONG TERM GOAL #3   Title Patient to demonstrate B LE strength >/=4+/5.    Baseline Limited by pain in back with resistance.    Status On-going      PT LONG TERM GOAL #4   Title Patient to lift 20lbs from floor to chest with good body mechanics and no pain.    Baseline pain limited    Status On-going  Plan - 10/09/21 1438     Clinical Impression Statement pt verb no changes in pain overall just temp relief after each PT session. pt slow and painful with ex. no goal progress    PT Treatment/Interventions ADLs/Self Care Home Management;Cryotherapy;Electrical Stimulation;Ultrasound;Traction;Moist Heat;Gait training;Stair training;Functional mobility training;Therapeutic activities;Therapeutic exercise;Balance training;Neuromuscular re-education;Manual techniques;Patient/family education;Passive range of motion;Dry needling;Energy conservation;Vasopneumatic Device;Taping    PT Next Visit Plan core strength             Patient will benefit from skilled therapeutic intervention in order to improve the following deficits and impairments:  Decreased range of motion, Difficulty walking, Increased fascial restricitons, Increased muscle spasms, Decreased safety awareness, Decreased activity tolerance, Pain, Decreased balance, Hypomobility, Impaired flexibility, Improper body mechanics, Postural dysfunction, Decreased strength  Visit Diagnosis: Muscle weakness (generalized)  Difficulty in walking, not elsewhere classified  Acute right-sided low back pain with bilateral sciatica     Problem List Patient Active Problem List   Diagnosis Date Noted   Right-sided low back pain without sciatica 04/29/2020   Acute pain of left shoulder 11/07/2018   History of hematuria 10/07/2018    History of pneumonia 10/07/2018   Healthcare maintenance 01/17/2018   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 [place], [place], [address] Phone: [phone]   Fax:  [phone]  Name: Raymond Stein MRN: [id number] Date of Birth: September 24, 1955

## 2021-10-16 ENCOUNTER — Ambulatory Visit: Payer: No Typology Code available for payment source | Admitting: Physical Therapy

## 2021-10-16 ENCOUNTER — Encounter: Payer: Self-pay | Admitting: Physical Therapy

## 2021-10-16 ENCOUNTER — Other Ambulatory Visit: Payer: Self-pay

## 2021-10-16 DIAGNOSIS — M6281 Muscle weakness (generalized): Secondary | ICD-10-CM

## 2021-10-16 DIAGNOSIS — M5441 Lumbago with sciatica, right side: Secondary | ICD-10-CM

## 2021-10-16 DIAGNOSIS — R262 Difficulty in walking, not elsewhere classified: Secondary | ICD-10-CM

## 2021-10-16 NOTE — Therapy (Signed)
[place]. [place], [place], [address] Phone: [phone]   Fax:  [phone]  Physical Therapy Treatment  Patient Details  Name: Raymond Stein MRN: [id number] Date of Birth: 05-May-1956 Referring Provider (PT): Emelda Brothers, MD   Encounter Date: 10/16/2021   PT End of Session - 10/16/21 1511     Visit Number 25    Date for PT Re-Evaluation 11/09/21    PT Start Time 1418    PT Stop Time 1512    PT Time Calculation (min) 54 min    Activity Tolerance Patient tolerated treatment well    Behavior During Therapy [facility] for tasks assessed/performed             History reviewed. No pertinent past medical history.  History reviewed. No pertinent surgical history.  There were no vitals filed for this visit.   Subjective Assessment - 10/16/21 1418     Subjective "About the same"    Currently in Pain? Yes    Pain Score 4                                [facility] Adult PT Treatment/Exercise - 10/16/21 0001       Lumbar Exercises: Stretches   Active Hamstring Stretch Left;Right;3 reps;10 seconds    Single Knee to Chest Stretch Right;Left;3 reps;10 seconds;20 seconds      Lumbar Exercises: Aerobic   UBE (Upper Arm Bike) L 2 1.18min fwd/1.5 min back standing    Nustep L5  x6 min (UEs/LEs)      Lumbar Exercises: Machines for Strengthening   Cybex Knee Flexion 20lb 2x10    Other Lumbar Machine Exercise cable pulleys row and shld ext 2 sets 10      Lumbar Exercises: Standing   Other Standing Lumbar Exercises 8in step up x 10 each, Standing march with yellow ball out front x 10    Other Standing Lumbar Exercises 5lb hip Ext x10      Lumbar Exercises: Seated   Sit to Stand 10 reps   yellow ball chest press x2                      PT Short Term Goals - 10/02/21 1641       PT SHORT TERM GOAL #1   Title Patient to be independent with initial HEP.    Status Achieved      PT  SHORT TERM GOAL #2   Title Patient will be I with updated HEP    Status Achieved               PT Long Term Goals - 10/09/21 1436       PT LONG TERM GOAL #1   Title Patient to be independent with advanced HEP.    Status Achieved      PT LONG TERM GOAL #2   Title Patient to demonstrate lumbar AROM WFL and without pain limiting.    Status On-going      PT LONG TERM GOAL #3   Title Patient to demonstrate B LE strength >/=4+/5.    Baseline Limited by pain in back with resistance.    Status On-going      PT LONG TERM GOAL #4   Title Patient to lift 20lbs from floor to chest with good body mechanics and no pain.    Baseline pain limited  Status On-going                   Plan - 10/16/21 1512     Clinical Impression Statement Pt stated he is still trying to decided if he wants surgery or injection per MD recommendation. Movement is slow and guarded at times. Encouragement needed to try increase resistance with interventions. Some weakness present with sit to stands.    Personal Factors and Comorbidities Age;Behavior Pattern;Time since onset of injury/illness/exacerbation;Past/Current Experience;Profession    Examination-Activity Limitations Locomotion Level;Transfers;Bed Mobility;Reach Overhead;Bend;Carry;Sleep;Squat;Stairs;Dressing;Hygiene/Grooming;Stand;Lift    Examination-Participation Restrictions [employer];Shop;Driving;Community Activity;Occupation;Cleaning;Church;Meal Prep    Stability/Clinical Decision Making Evolving/Moderate complexity    PT Frequency 1x / week    PT Duration 4 weeks    PT Treatment/Interventions ADLs/Self Care Home Management;Cryotherapy;Electrical Stimulation;Ultrasound;Traction;Moist Heat;Gait training;Stair training;Functional mobility training;Therapeutic activities;Therapeutic exercise;Balance training;Neuromuscular re-education;Manual techniques;Patient/family education;Passive range of motion;Dry needling;Energy  conservation;Vasopneumatic Device;Taping    PT Next Visit Plan core strength             Patient will benefit from skilled therapeutic intervention in order to improve the following deficits and impairments:  Decreased range of motion, Difficulty walking, Increased fascial restricitons, Increased muscle spasms, Decreased safety awareness, Decreased activity tolerance, Pain, Decreased balance, Hypomobility, Impaired flexibility, Improper body mechanics, Postural dysfunction, Decreased strength  Visit Diagnosis: Muscle weakness (generalized)  Difficulty in walking, not elsewhere classified  Acute right-sided low back pain with bilateral sciatica     Problem List Patient Active Problem List   Diagnosis Date Noted   Right-sided low back pain without sciatica 04/29/2020   Acute pain of left shoulder 11/07/2018   History of hematuria 10/07/2018   History of pneumonia 10/07/2018   Healthcare maintenance 01/17/2018   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 [place], [place], [address] Phone: [phone]   Fax:  [phone]  Name: Raymond Stein MRN: [id number] Date of Birth: [birth year]

## 2021-10-23 ENCOUNTER — Ambulatory Visit: Payer: No Typology Code available for payment source | Admitting: Physical Therapy

## 2021-10-30 ENCOUNTER — Ambulatory Visit: Payer: No Typology Code available for payment source | Admitting: Physical Therapy

## 2022-01-06 IMAGING — DX DG LUMBAR SPINE COMPLETE 4+V
5 series · 5 of 5 positions shown · non-contrast
Comparison: None.

CLINICAL DATA: Right-sided back pain, no known injury, initial
encounter

EXAM:
LUMBAR SPINE - COMPLETE 4+ VIEW

[lumbar spine ap]
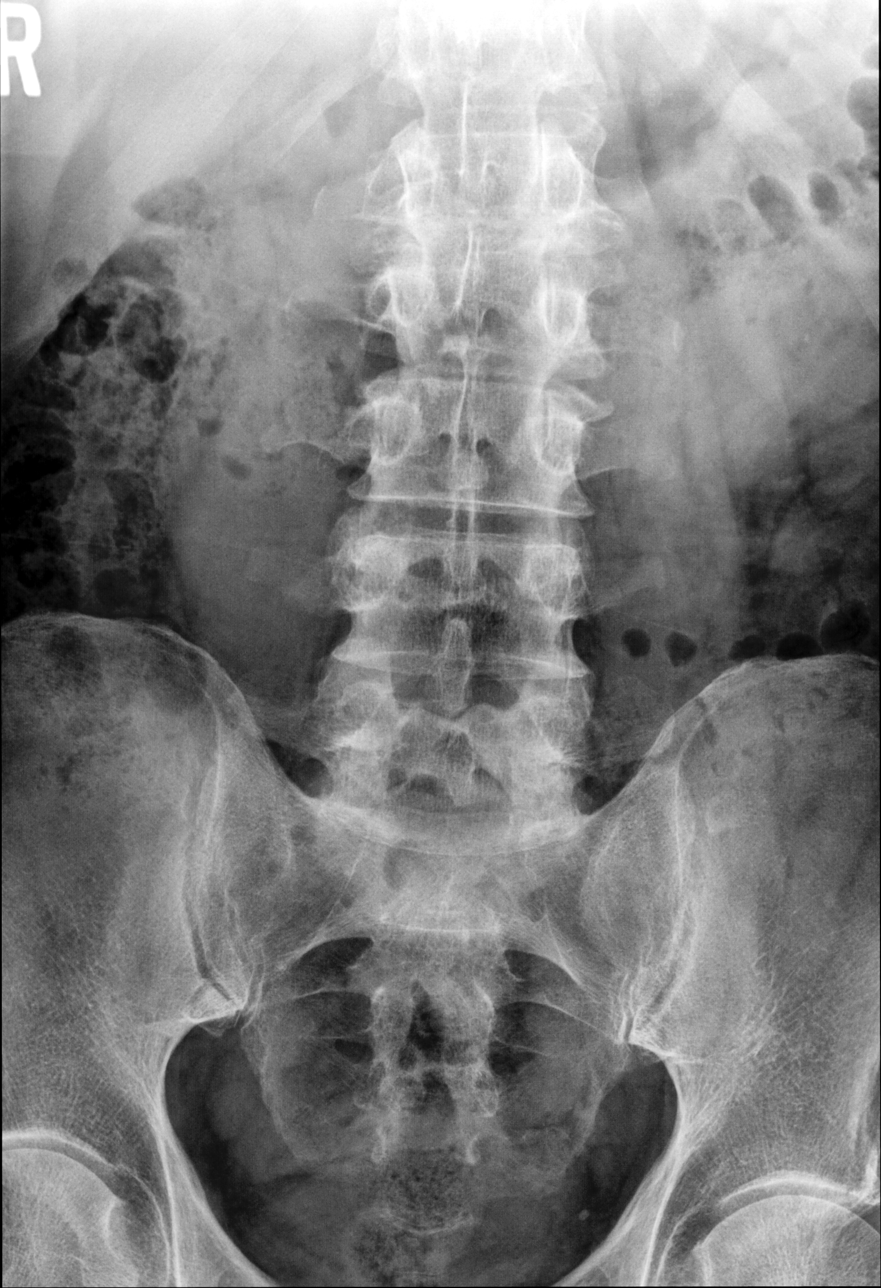

[lumbar oblique]
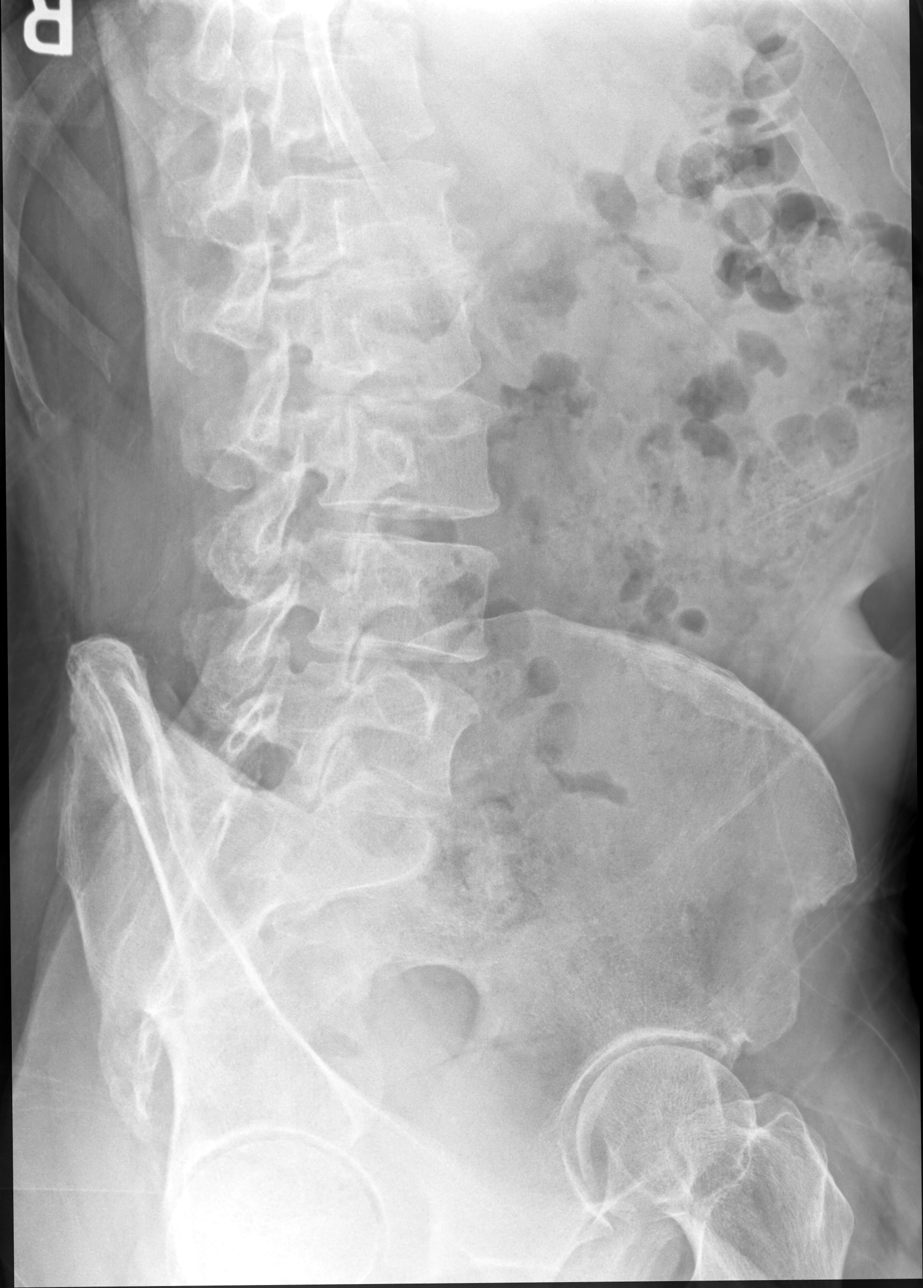

[lumbar lateral]
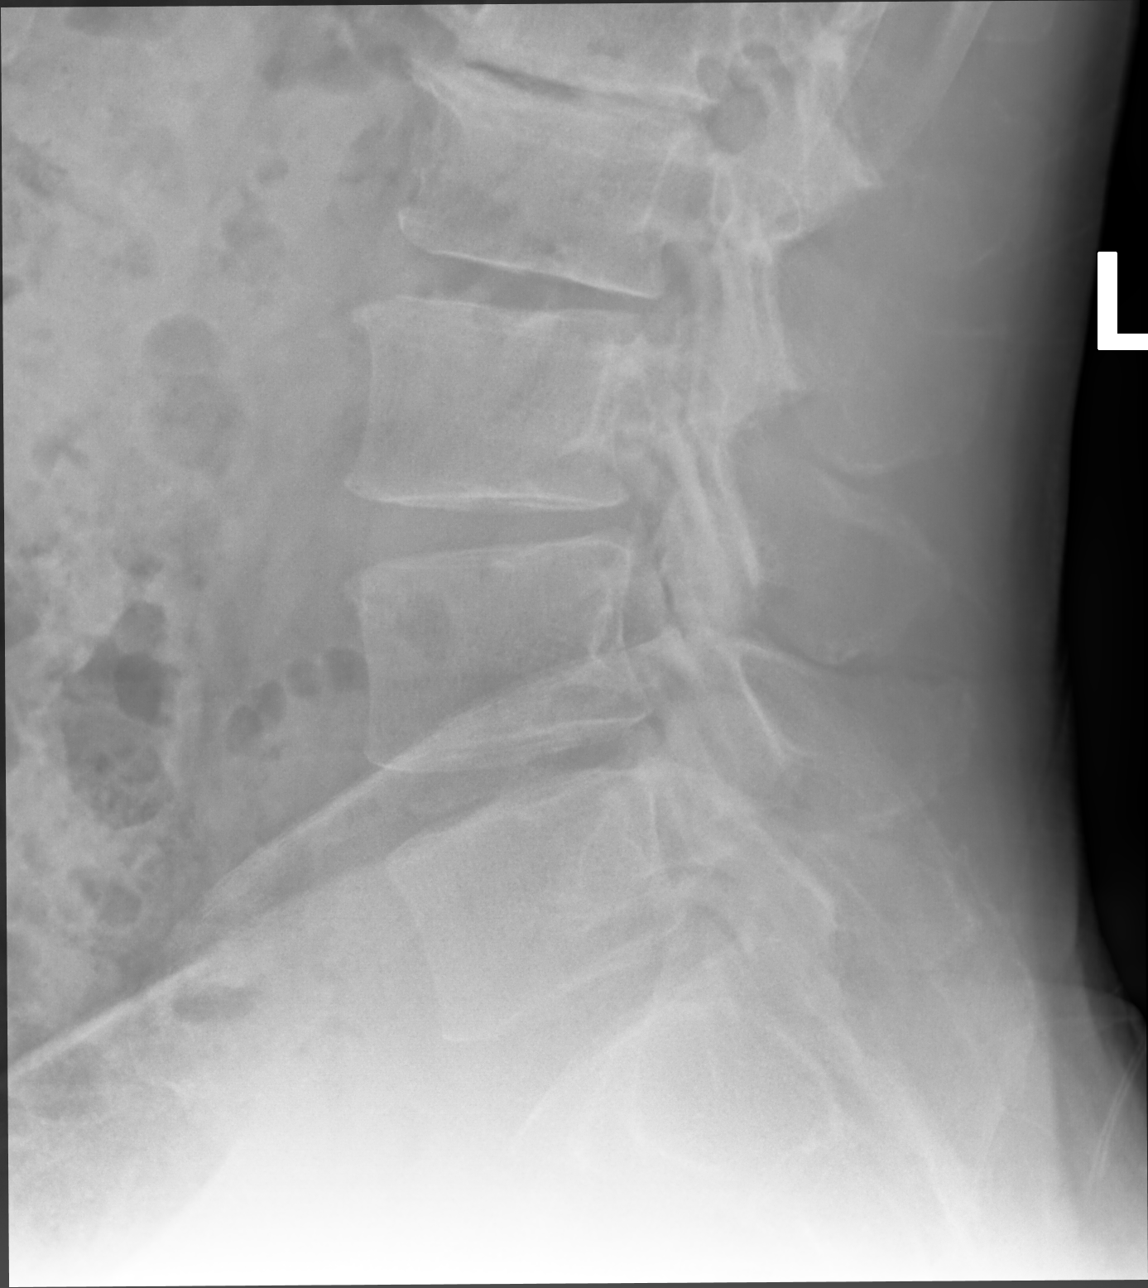

[lumbar spine lat]
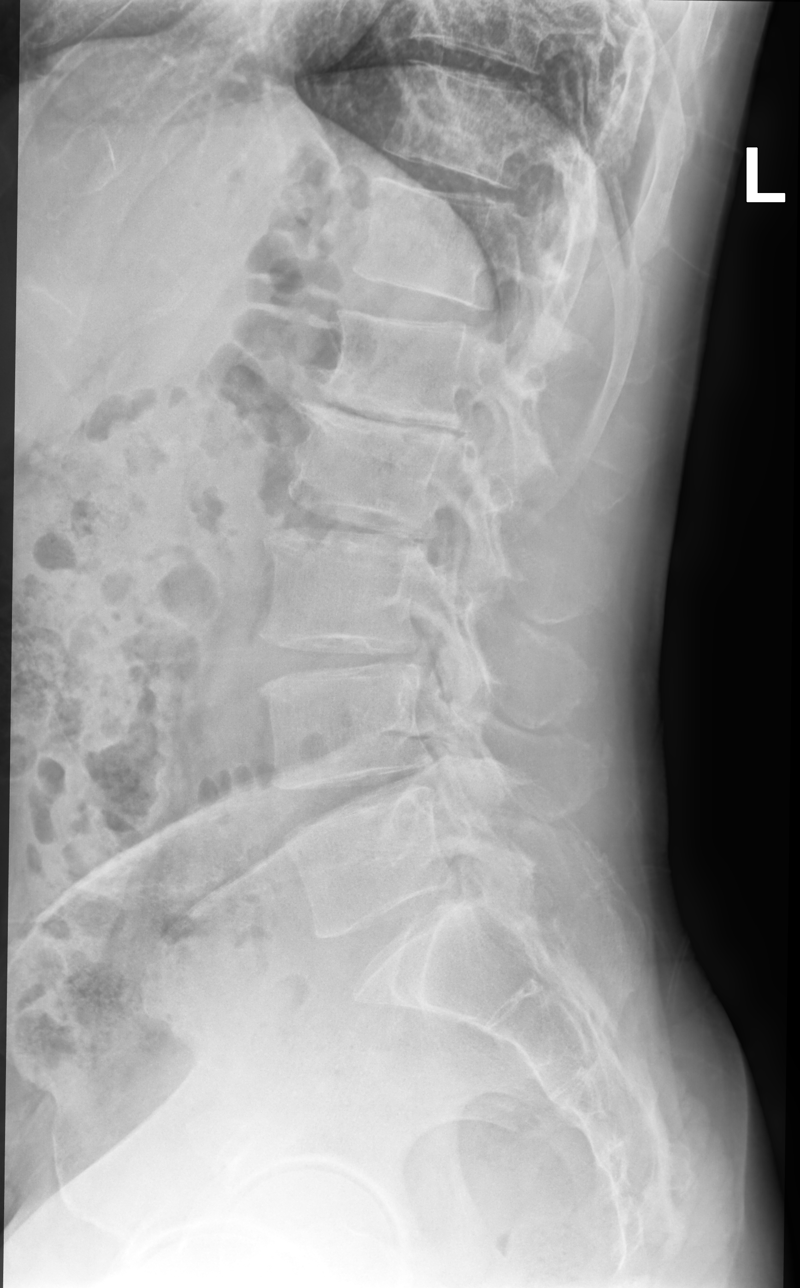

[lumbar spine oblique]
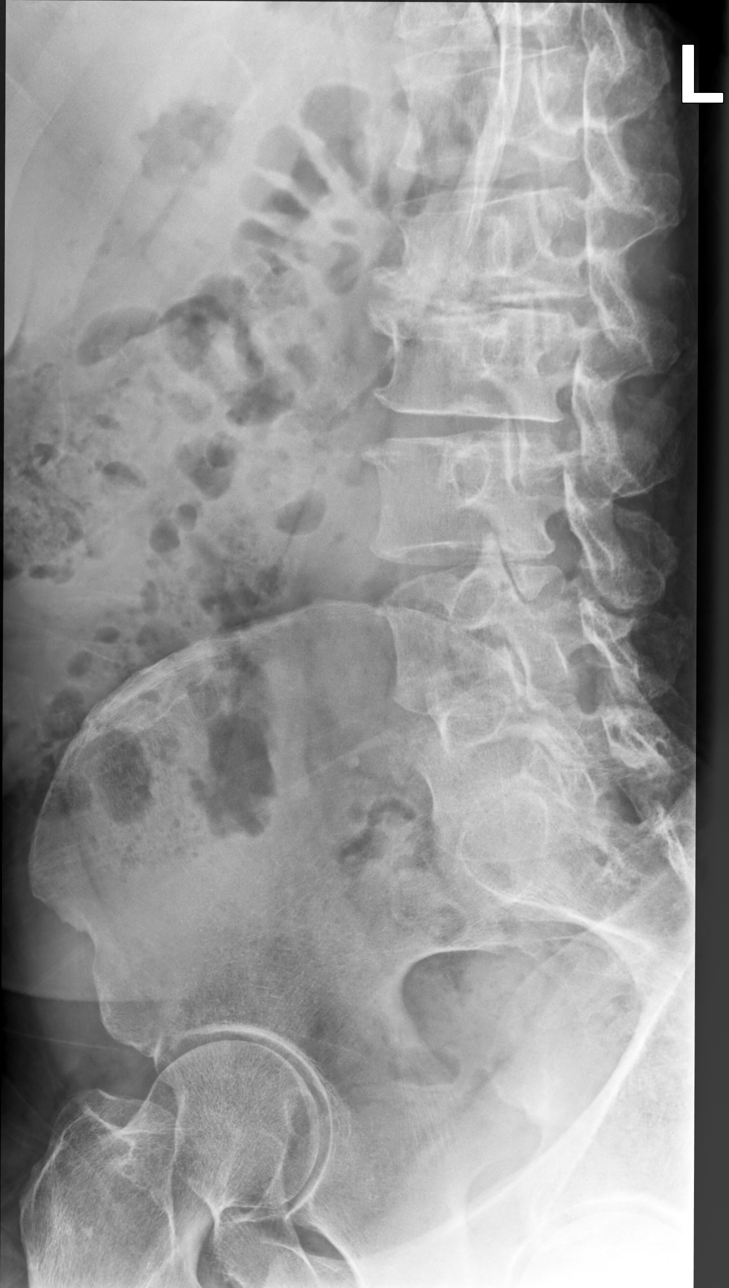

[5 of 5 positions shown; findings below may reference images not displayed]

FINDINGS: Five lumbar type vertebral bodies are well visualized. Vertebral
body height is well maintained. No pars defects are noted. No
anterolisthesis is seen. Disc space narrowing is noted at L1-2 and
L2-3 and to a lesser degree at L3-4. No soft tissue abnormality is
noted.
IMPRESSION: Degenerative change without acute abnormality.

## 2022-02-10 DIAGNOSIS — M6281 Muscle weakness (generalized): Secondary | ICD-10-CM | POA: Diagnosis not present

## 2022-02-10 DIAGNOSIS — M5416 Radiculopathy, lumbar region: Secondary | ICD-10-CM | POA: Diagnosis not present

## 2022-03-05 DIAGNOSIS — M5416 Radiculopathy, lumbar region: Secondary | ICD-10-CM | POA: Diagnosis not present

## 2022-03-05 DIAGNOSIS — M6281 Muscle weakness (generalized): Secondary | ICD-10-CM | POA: Diagnosis not present

## 2022-03-19 DIAGNOSIS — M4316 Spondylolisthesis, lumbar region: Secondary | ICD-10-CM | POA: Diagnosis not present

## 2022-03-19 DIAGNOSIS — M5416 Radiculopathy, lumbar region: Secondary | ICD-10-CM | POA: Diagnosis not present

## 2022-04-03 DIAGNOSIS — M6281 Muscle weakness (generalized): Secondary | ICD-10-CM | POA: Diagnosis not present

## 2022-04-03 DIAGNOSIS — M5416 Radiculopathy, lumbar region: Secondary | ICD-10-CM | POA: Diagnosis not present

## 2022-04-17 DIAGNOSIS — M5416 Radiculopathy, lumbar region: Secondary | ICD-10-CM | POA: Diagnosis not present

## 2022-04-17 DIAGNOSIS — M6281 Muscle weakness (generalized): Secondary | ICD-10-CM | POA: Diagnosis not present

## 2022-04-23 DIAGNOSIS — M5136 Other intervertebral disc degeneration, lumbar region: Secondary | ICD-10-CM | POA: Diagnosis not present

## 2022-04-23 DIAGNOSIS — M4316 Spondylolisthesis, lumbar region: Secondary | ICD-10-CM | POA: Diagnosis not present

## 2022-04-23 DIAGNOSIS — M5126 Other intervertebral disc displacement, lumbar region: Secondary | ICD-10-CM | POA: Diagnosis not present

## 2022-05-01 DIAGNOSIS — M6281 Muscle weakness (generalized): Secondary | ICD-10-CM | POA: Diagnosis not present

## 2022-05-01 DIAGNOSIS — M5416 Radiculopathy, lumbar region: Secondary | ICD-10-CM | POA: Diagnosis not present

## 2022-05-06 DIAGNOSIS — M5416 Radiculopathy, lumbar region: Secondary | ICD-10-CM | POA: Diagnosis not present

## 2022-05-06 DIAGNOSIS — M4316 Spondylolisthesis, lumbar region: Secondary | ICD-10-CM | POA: Diagnosis not present

## 2022-05-14 DIAGNOSIS — M6281 Muscle weakness (generalized): Secondary | ICD-10-CM | POA: Diagnosis not present

## 2022-05-14 DIAGNOSIS — M5416 Radiculopathy, lumbar region: Secondary | ICD-10-CM | POA: Diagnosis not present

## 2022-05-27 DIAGNOSIS — M5416 Radiculopathy, lumbar region: Secondary | ICD-10-CM | POA: Diagnosis not present

## 2022-06-05 DIAGNOSIS — M5416 Radiculopathy, lumbar region: Secondary | ICD-10-CM | POA: Diagnosis not present

## 2022-06-05 DIAGNOSIS — M6281 Muscle weakness (generalized): Secondary | ICD-10-CM | POA: Diagnosis not present

## 2022-08-20 DIAGNOSIS — J209 Acute bronchitis, unspecified: Secondary | ICD-10-CM | POA: Diagnosis not present

## 2022-08-20 DIAGNOSIS — Z20822 Contact with and (suspected) exposure to covid-19: Secondary | ICD-10-CM | POA: Diagnosis not present

## 2022-11-06 DIAGNOSIS — M4316 Spondylolisthesis, lumbar region: Secondary | ICD-10-CM | POA: Diagnosis not present

## 2023-03-10 DIAGNOSIS — S93601A Unspecified sprain of right foot, initial encounter: Secondary | ICD-10-CM | POA: Diagnosis not present

## 2023-03-10 DIAGNOSIS — S93491A Sprain of other ligament of right ankle, initial encounter: Secondary | ICD-10-CM | POA: Diagnosis not present

## 2023-03-10 DIAGNOSIS — S92514A Nondisplaced fracture of proximal phalanx of right lesser toe(s), initial encounter for closed fracture: Secondary | ICD-10-CM | POA: Diagnosis not present

## 2023-03-24 DIAGNOSIS — S92514D Nondisplaced fracture of proximal phalanx of right lesser toe(s), subsequent encounter for fracture with routine healing: Secondary | ICD-10-CM | POA: Diagnosis not present

## 2023-03-24 DIAGNOSIS — S93491D Sprain of other ligament of right ankle, subsequent encounter: Secondary | ICD-10-CM | POA: Diagnosis not present

## 2023-04-14 DIAGNOSIS — M4316 Spondylolisthesis, lumbar region: Secondary | ICD-10-CM | POA: Diagnosis not present

## 2023-04-14 DIAGNOSIS — M47816 Spondylosis without myelopathy or radiculopathy, lumbar region: Secondary | ICD-10-CM | POA: Diagnosis not present

## 2023-05-04 ENCOUNTER — Encounter: Payer: Medicare Other | Admitting: Family Medicine

## 2023-05-07 ENCOUNTER — Ambulatory Visit (INDEPENDENT_AMBULATORY_CARE_PROVIDER_SITE_OTHER): Payer: PPO | Admitting: Family Medicine

## 2023-05-07 ENCOUNTER — Encounter: Payer: Self-pay | Admitting: Family Medicine

## 2023-05-07 VITALS — BP 118/78 | HR 75 | Temp 97.7°F | Ht 72.0 in | Wt 165.4 lb

## 2023-05-07 DIAGNOSIS — N400 Enlarged prostate without lower urinary tract symptoms: Secondary | ICD-10-CM

## 2023-05-07 DIAGNOSIS — M5440 Lumbago with sciatica, unspecified side: Secondary | ICD-10-CM

## 2023-05-07 DIAGNOSIS — R7309 Other abnormal glucose: Secondary | ICD-10-CM | POA: Diagnosis not present

## 2023-05-07 DIAGNOSIS — G8929 Other chronic pain: Secondary | ICD-10-CM | POA: Diagnosis not present

## 2023-05-07 DIAGNOSIS — Z0000 Encounter for general adult medical examination without abnormal findings: Secondary | ICD-10-CM | POA: Diagnosis not present

## 2023-05-07 DIAGNOSIS — Z13220 Encounter for screening for lipoid disorders: Secondary | ICD-10-CM | POA: Diagnosis not present

## 2023-05-07 DIAGNOSIS — Z1211 Encounter for screening for malignant neoplasm of colon: Secondary | ICD-10-CM | POA: Diagnosis not present

## 2023-05-07 DIAGNOSIS — Z5320 Procedure and treatment not carried out because of patient's decision for unspecified reasons: Secondary | ICD-10-CM

## 2023-05-07 DIAGNOSIS — M5441 Lumbago with sciatica, right side: Secondary | ICD-10-CM

## 2023-05-07 LAB — URINALYSIS, ROUTINE W REFLEX MICROSCOPIC
Bilirubin Urine: NEGATIVE
Hgb urine dipstick: NEGATIVE
Leukocytes,Ua: NEGATIVE
Nitrite: NEGATIVE
Specific Gravity, Urine: 1.015 (ref 1.000–1.030)
Total Protein, Urine: NEGATIVE
Urine Glucose: NEGATIVE
Urobilinogen, UA: 0.2 (ref 0.0–1.0)
pH: 7 (ref 5.0–8.0)

## 2023-05-07 LAB — CBC
HCT: 44 % (ref 39.0–52.0)
Hemoglobin: 14.3 g/dL (ref 13.0–17.0)
MCHC: 32.4 g/dL (ref 30.0–36.0)
MCV: 88.9 fl (ref 78.0–100.0)
Platelets: 248 10*3/uL (ref 150.0–400.0)
RBC: 4.95 Mil/uL (ref 4.22–5.81)
RDW: 13.5 % (ref 11.5–15.5)
WBC: 5.3 10*3/uL (ref 4.0–10.5)

## 2023-05-07 LAB — LIPID PANEL
Cholesterol: 157 mg/dL (ref 0–200)
HDL: 47 mg/dL (ref >39.00)
LDL Cholesterol: 94 mg/dL (ref 0–99)
NonHDL: 109.77
Total CHOL/HDL Ratio: 3
Triglycerides: 80 mg/dL (ref 0.0–149.0)
VLDL: 16 mg/dL (ref 0.0–40.0)

## 2023-05-07 LAB — COMPREHENSIVE METABOLIC PANEL
ALT: 11 U/L (ref 0–53)
AST: 16 U/L (ref 0–37)
Albumin: 4.3 g/dL (ref 3.5–5.2)
Alkaline Phosphatase: 59 U/L (ref 39–117)
BUN: 8 mg/dL (ref 6–23)
CO2: 32 mEq/L (ref 19–32)
Calcium: 9.6 mg/dL (ref 8.4–10.5)
Chloride: 100 mEq/L (ref 96–112)
Creatinine, Ser: 1.2 mg/dL (ref 0.40–1.50)
GFR: 63.68 mL/min (ref >60.00)
Glucose, Bld: 86 mg/dL (ref 70–99)
Potassium: 4 mEq/L (ref 3.5–5.1)
Sodium: 139 mEq/L (ref 135–145)
Total Bilirubin: 1.1 mg/dL (ref 0.2–1.2)
Total Protein: 6.9 g/dL (ref 6.0–8.3)

## 2023-05-07 LAB — PSA: PSA: 4.44 ng/mL — ABNORMAL HIGH (ref 0.10–4.00)

## 2023-05-07 LAB — HEMOGLOBIN A1C: Hgb A1c MFr Bld: 4.7 % (ref 4.6–6.5)

## 2023-05-07 NOTE — Progress Notes (Signed)
Established Patient Office Visit   Subjective:  Patient ID: Raymond Stein, male    DOB: 11-07-1955  Age: 67 y.o. MRN: [id number]  Chief Complaint  Patient presents with   Annual Exam    Cpe. Pt is fasting. Had a fall down the steps where he broke 2 places of right foot and toe.     HPI Encounter Diagnoses  Name Primary?   Healthcare maintenance Yes   Chronic bilateral low back pain with sciatica, sciatica laterality unspecified    Screening for cholesterol level    Screening for hepatitis C declined    Elevated glucose    Screening for colon cancer    Benign prostatic hyperplasia without lower urinary tract symptoms    Here for physical exam and follow-up of some ongoing issues.  Accompanied by his wife Charlene.  Lost to follow-up for 3 years.  He is retired and disabled from chronic back pain.  Status post surgery in 2021.  Continues follow-up with neurosurgery.  He has never had a colonoscopy.  BPH by prior exam but denies issues with urine flow or nocturia.  He is able to exercise by walking.  He does have regular dental care.  He lives at home with his wife.  They have 2 children are grown and live locally.   Review of Systems  Constitutional: Negative.   HENT: Negative.    Eyes:  Negative for blurred vision, discharge and redness.  Respiratory: Negative.    Cardiovascular: Negative.   Gastrointestinal:  Negative for abdominal pain.  Genitourinary: Negative.   Musculoskeletal:  Positive for back pain. Negative for myalgias.  Skin:  Negative for rash.  Neurological:  Negative for tingling, loss of consciousness and weakness.  Endo/Heme/Allergies:  Negative for polydipsia.     Current Outpatient Medications:    meloxicam (MOBIC) 15 MG tablet, TAKE 1 TABLET BY MOUTH DAILY AS NEEDED FOR PAIN, Disp: 30 tablet, Rfl: 1   cyclobenzaprine (FLEXERIL) 5 MG tablet, Take 1-2 tablets (5-10 mg total) by mouth 2 (two) times daily as needed for muscle spasms. (Patient not taking:  Reported on 04/29/2020), Disp: 24 tablet, Rfl: 0   predniSONE (DELTASONE) 10 MG tablet, Begin with 6 tabs on day 1, 5 tab on day 2, 4 tab on day 3, 3 tab on day 4, 2 tab on day 5, 1 tab on day 6-take with food (Patient not taking: Reported on 04/29/2020), Disp: 21 tablet, Rfl: 0   sildenafil (REVATIO) 20 MG tablet, Take 1-3 tablets 45 minutes prior (Patient not taking: Reported on 04/29/2020), Disp: 50 tablet, Rfl: 0   Objective:     BP 118/78   Pulse 75   Temp 97.7 F (36.5 C)   Ht 6' (1.829 m)   Wt 165 lb 6.4 oz (75 kg)   SpO2 98%   BMI 22.43 kg/m    Physical Exam Constitutional:      General: He is not in acute distress.    Appearance: Normal appearance. He is not ill-appearing, toxic-appearing or diaphoretic.  HENT:     Head: Normocephalic and atraumatic.     Right Ear: Tympanic membrane, ear canal and external ear normal.     Left Ear: Tympanic membrane, ear canal and external ear normal.     Mouth/Throat:     Mouth: Mucous membranes are moist.     Pharynx: Oropharynx is clear. No oropharyngeal exudate or posterior oropharyngeal erythema.  Eyes:     General: No scleral icterus.  Right eye: No discharge.        Left eye: No discharge.     Extraocular Movements: Extraocular movements intact.     Conjunctiva/sclera: Conjunctivae normal.     Pupils: Pupils are equal, round, and reactive to light.  Cardiovascular:     Rate and Rhythm: Normal rate and regular rhythm.  Pulmonary:     Effort: Pulmonary effort is normal. No respiratory distress.     Breath sounds: Normal breath sounds.  Abdominal:     General: Bowel sounds are normal.     Tenderness: There is no abdominal tenderness. There is no guarding or rebound.  Musculoskeletal:     Cervical back: No rigidity or tenderness.  Lymphadenopathy:     Cervical: No cervical adenopathy.  Skin:    General: Skin is warm and dry.  Neurological:     Mental Status: He is alert and oriented to person, place, and time.   Psychiatric:        Mood and Affect: Mood normal.        Behavior: Behavior normal.      No results found for any visits on 05/07/23.    The ASCVD Risk score (Arnett DK, et al., 2019) failed to calculate for the following reasons:   Cannot find a previous HDL lab   Cannot find a previous total cholesterol lab    Assessment & Plan:   Healthcare maintenance -     Comprehensive metabolic panel -     Urinalysis, Routine w reflex microscopic  Chronic bilateral low back pain with sciatica, sciatica laterality unspecified -     CBC -     Ambulatory referral to Neurosurgery  Screening for cholesterol level -     Lipid panel  Screening for hepatitis C declined  Elevated glucose -     Hemoglobin A1c  Screening for colon cancer -     Ambulatory referral to Gastroenterology  Benign prostatic hyperplasia without lower urinary tract symptoms -     PSA    Return in about 1 year (around 05/06/2024), or if symptoms worsen or fail to improve.  Encouraged to exercise by walking as tolerated.  Would like a second opinion for his back issues.  Information was given on health maintenance and disease prevention.  Patient declines pneumonia and Shingrix vaccine.  Information was given on vaccines  Mliss Sax, MD

## 2023-05-13 ENCOUNTER — Telehealth: Payer: Self-pay | Admitting: Family Medicine

## 2023-05-13 NOTE — Telephone Encounter (Signed)
The facility, Dr Louie Casa office.

## 2023-05-13 NOTE — Telephone Encounter (Signed)
The referral Dr Doreene Burke sent over on 05/07/23 to Dr Marlis Edelson neurosurgery, his [place] is OON at their facility.

## 2023-05-13 NOTE — Telephone Encounter (Signed)
I contacted the patient to inform, that we have to send to another location. I also sent an inbasket message to PCP to see if he had another provider or if [facility] was okay. Awaiting on advise from provider

## 2023-05-14 NOTE — Addendum Note (Signed)
Addended by: Andrez Grime on: 05/14/2023 04:46 PM   Modules accepted: Orders

## 2023-06-11 ENCOUNTER — Other Ambulatory Visit: Payer: Self-pay | Admitting: Family Medicine

## 2023-06-11 DIAGNOSIS — Z1211 Encounter for screening for malignant neoplasm of colon: Secondary | ICD-10-CM

## 2023-06-11 DIAGNOSIS — Z1212 Encounter for screening for malignant neoplasm of rectum: Secondary | ICD-10-CM

## 2024-05-09 ENCOUNTER — Ambulatory Visit (INDEPENDENT_AMBULATORY_CARE_PROVIDER_SITE_OTHER): Payer: PPO | Admitting: Family Medicine

## 2024-05-09 ENCOUNTER — Encounter: Payer: Self-pay | Admitting: Family Medicine

## 2024-05-09 VITALS — BP 135/70 | HR 81 | Temp 97.6°F | Ht 72.0 in | Wt 160.4 lb

## 2024-05-09 DIAGNOSIS — N400 Enlarged prostate without lower urinary tract symptoms: Secondary | ICD-10-CM | POA: Diagnosis not present

## 2024-05-09 DIAGNOSIS — R499 Unspecified voice and resonance disorder: Secondary | ICD-10-CM | POA: Insufficient documentation

## 2024-05-09 DIAGNOSIS — Z0000 Encounter for general adult medical examination without abnormal findings: Secondary | ICD-10-CM | POA: Diagnosis not present

## 2024-05-09 DIAGNOSIS — N5201 Erectile dysfunction due to arterial insufficiency: Secondary | ICD-10-CM | POA: Diagnosis not present

## 2024-05-09 DIAGNOSIS — R972 Elevated prostate specific antigen [PSA]: Secondary | ICD-10-CM

## 2024-05-09 DIAGNOSIS — Z13220 Encounter for screening for lipoid disorders: Secondary | ICD-10-CM

## 2024-05-09 DIAGNOSIS — R7309 Other abnormal glucose: Secondary | ICD-10-CM

## 2024-05-09 LAB — URINALYSIS, ROUTINE W REFLEX MICROSCOPIC
Bilirubin Urine: NEGATIVE
Hgb urine dipstick: NEGATIVE
Ketones, ur: NEGATIVE
Leukocytes,Ua: NEGATIVE
Nitrite: NEGATIVE
RBC / HPF: NONE SEEN (ref 0–?)
Specific Gravity, Urine: <=1.005 — AB (ref 1.000–1.030)
Total Protein, Urine: NEGATIVE
Urine Glucose: NEGATIVE
Urobilinogen, UA: 0.2 (ref 0.0–1.0)
pH: 6.5 (ref 5.0–8.0)

## 2024-05-09 LAB — CBC WITH DIFFERENTIAL/PLATELET
Basophils Absolute: 0 K/uL (ref 0.0–0.1)
Basophils Relative: 1 % (ref 0.0–3.0)
Eosinophils Absolute: 0.2 K/uL (ref 0.0–0.7)
Eosinophils Relative: 4.4 % (ref 0.0–5.0)
HCT: 43.4 % (ref 39.0–52.0)
Hemoglobin: 14.3 g/dL (ref 13.0–17.0)
Lymphocytes Relative: 32.6 % (ref 12.0–46.0)
Lymphs Abs: 1.6 K/uL (ref 0.7–4.0)
MCHC: 32.9 g/dL (ref 30.0–36.0)
MCV: 87 fl (ref 78.0–100.0)
Monocytes Absolute: 0.3 K/uL (ref 0.1–1.0)
Monocytes Relative: 7 % (ref 3.0–12.0)
Neutro Abs: 2.7 K/uL (ref 1.4–7.7)
Neutrophils Relative %: 55 % (ref 43.0–77.0)
Platelets: 283 K/uL (ref 150.0–400.0)
RBC: 4.99 Mil/uL (ref 4.22–5.81)
RDW: 13.4 % (ref 11.5–15.5)
WBC: 5 K/uL (ref 4.0–10.5)

## 2024-05-09 LAB — PSA: PSA: 7.04 ng/mL — ABNORMAL HIGH (ref 0.10–4.00)

## 2024-05-09 LAB — COMPREHENSIVE METABOLIC PANEL WITH GFR
ALT: 13 U/L (ref 0–53)
AST: 18 U/L (ref 0–37)
Albumin: 4.7 g/dL (ref 3.5–5.2)
Alkaline Phosphatase: 71 U/L (ref 39–117)
BUN: 8 mg/dL (ref 6–23)
CO2: 30 meq/L (ref 19–32)
Calcium: 9.3 mg/dL (ref 8.4–10.5)
Chloride: 98 meq/L (ref 96–112)
Creatinine, Ser: 1.14 mg/dL (ref 0.40–1.50)
GFR: 67.24 mL/min (ref >60.00)
Glucose, Bld: 93 mg/dL (ref 70–99)
Potassium: 4.1 meq/L (ref 3.5–5.1)
Sodium: 139 meq/L (ref 135–145)
Total Bilirubin: 1.1 mg/dL (ref 0.2–1.2)
Total Protein: 7.6 g/dL (ref 6.0–8.3)

## 2024-05-09 LAB — LIPID PANEL
Cholesterol: 176 mg/dL (ref 0–200)
HDL: 54.1 mg/dL (ref >39.00)
LDL Cholesterol: 107 mg/dL — ABNORMAL HIGH (ref 0–99)
NonHDL: 122.29
Total CHOL/HDL Ratio: 3
Triglycerides: 76 mg/dL (ref 0.0–149.0)
VLDL: 15.2 mg/dL (ref 0.0–40.0)

## 2024-05-09 LAB — HEMOGLOBIN A1C: Hgb A1c MFr Bld: 4.8 % (ref 4.6–6.5)

## 2024-05-09 MED ORDER — TADALAFIL 20 MG PO TABS
10.0000 mg | ORAL_TABLET | ORAL | 11 refills | Status: AC | PRN
Start: 2024-05-09 — End: ?

## 2024-05-09 NOTE — Progress Notes (Addendum)
 Established Patient Office Visit   Subjective:  Patient ID: Raymond Stein, male    DOB: 11/05/1955  Age: 68 y.o. MRN: [id number]  Chief Complaint  Patient presents with   Annual Exam    CPE. Pt is fasting.     HPI Encounter Diagnoses  Name Primary?   Healthcare maintenance Yes   Screening for cholesterol level    Benign prostatic hyperplasia without lower urinary tract symptoms    Elevated glucose    Erectile dysfunction due to arterial insufficiency    Change in voice    Elevated PSA, less than 10 ng/ml    Here accompanied by his wife Charlene for a physical and follow-up of the above.  Has been doing well.  He is staying active physically by walking most days weekly.  PSA was mildly elevated at last visit.  No family history of prostate disease.  Urine flow is good for him.  Denies hesitancy or urgency.  He does experience nocturia when he consumes fluids before bedtime.  Feels as though there has been a change in his voice.  He quit tobacco 30 years ago.  He had tried sildenafil  for ED and felt as though it had given him mild palpitations.  He would like to try something new.  He continues to refuse Prevnar and Shingrix.  Advised to have his Tdap through his pharmacy.   Review of Systems  Constitutional: Negative.   HENT: Negative.    Eyes:  Negative for blurred vision, discharge and redness.  Respiratory: Negative.    Cardiovascular: Negative.   Gastrointestinal:  Negative for abdominal pain.  Genitourinary: Negative.   Musculoskeletal: Negative.  Negative for myalgias.  Skin:  Negative for rash.  Neurological:  Negative for tingling, loss of consciousness and weakness.  Endo/Heme/Allergies:  Negative for polydipsia.      05/09/2024   10:49 AM 05/07/2023   11:17 AM 04/29/2020    3:49 PM  Depression screen PHQ 2/9  Decreased Interest 0 0 0  Down, Depressed, Hopeless 0 0 0  PHQ - 2 Score 0 0 0  Altered sleeping 0 0 0  Tired, decreased energy 0 1 0  Change in appetite  0 0 0  Feeling bad or failure about yourself  0 0 0  Trouble concentrating 0 0 0  Moving slowly or fidgety/restless 0 0 0  Suicidal thoughts 0 0 0  PHQ-9 Score 0 1 0  Difficult doing work/chores Not difficult at all Not difficult at all       Current Outpatient Medications:    tadalafil  (CIALIS ) 20 MG tablet, Take 0.5-1 tablets (10-20 mg total) by mouth every other day as needed for erectile dysfunction., Disp: 10 tablet, Rfl: 11   meloxicam  (MOBIC ) 15 MG tablet, TAKE 1 TABLET BY MOUTH DAILY AS NEEDED FOR PAIN (Patient not taking: Reported on 05/09/2024), Disp: 30 tablet, Rfl: 1   Objective:     BP 135/70 (BP Location: Right Arm, Patient Position: Sitting, Cuff Size: Normal)   Pulse 81   Temp 97.6 F (36.4 C) (Temporal)   Ht 6' (1.829 m)   Wt 160 lb 6.4 oz (72.8 kg)   SpO2 98%   BMI 21.75 kg/m    Physical Exam Constitutional:      General: He is not in acute distress.    Appearance: Normal appearance. He is not ill-appearing, toxic-appearing or diaphoretic.  HENT:     Head: Normocephalic and atraumatic.     Right Ear: Tympanic membrane, ear canal  and external ear normal.     Left Ear: Tympanic membrane, ear canal and external ear normal.     Mouth/Throat:     Mouth: Mucous membranes are moist.     Pharynx: Oropharynx is clear. No oropharyngeal exudate or posterior oropharyngeal erythema.  Eyes:     General: No scleral icterus.       Right eye: No discharge.        Left eye: No discharge.     Extraocular Movements: Extraocular movements intact.     Conjunctiva/sclera: Conjunctivae normal.     Pupils: Pupils are equal, round, and reactive to light.  Cardiovascular:     Rate and Rhythm: Normal rate and regular rhythm.  Pulmonary:     Effort: Pulmonary effort is normal. No respiratory distress.     Breath sounds: Normal breath sounds.  Abdominal:     General: Bowel sounds are normal.     Tenderness: There is no abdominal tenderness. There is no guarding or rebound.   Musculoskeletal:     Cervical back: No rigidity or tenderness.  Skin:    General: Skin is warm and dry.  Neurological:     Mental Status: He is alert and oriented to person, place, and time.  Psychiatric:        Mood and Affect: Mood normal.        Behavior: Behavior normal.      Results for orders placed or performed in visit on 05/09/24  CBC with Differential/Platelet  Result Value Ref Range   WBC 5.0 4.0 - 10.5 K/uL   RBC 4.99 4.22 - 5.81 Mil/uL   Hemoglobin 14.3 13.0 - 17.0 g/dL   HCT 56.5 60.9 - 47.9 %   MCV 87.0 78.0 - 100.0 fl   MCHC 32.9 30.0 - 36.0 g/dL   RDW 86.5 88.4 - 84.4 %   Platelets 283.0 150.0 - 400.0 K/uL   Neutrophils Relative % 55.0 43.0 - 77.0 %   Lymphocytes Relative 32.6 12.0 - 46.0 %   Monocytes Relative 7.0 3.0 - 12.0 %   Eosinophils Relative 4.4 0.0 - 5.0 %   Basophils Relative 1.0 0.0 - 3.0 %   Neutro Abs 2.7 1.4 - 7.7 K/uL   Lymphs Abs 1.6 0.7 - 4.0 K/uL   Monocytes Absolute 0.3 0.1 - 1.0 K/uL   Eosinophils Absolute 0.2 0.0 - 0.7 K/uL   Basophils Absolute 0.0 0.0 - 0.1 K/uL  Comprehensive metabolic panel with GFR  Result Value Ref Range   Sodium 139 135 - 145 mEq/L   Potassium 4.1 3.5 - 5.1 mEq/L   Chloride 98 96 - 112 mEq/L   CO2 30 19 - 32 mEq/L   Glucose, Bld 93 70 - 99 mg/dL   BUN 8 6 - 23 mg/dL   Creatinine, Ser 8.85 0.40 - 1.50 mg/dL   Total Bilirubin 1.1 0.2 - 1.2 mg/dL   Alkaline Phosphatase 71 39 - 117 U/L   AST 18 0 - 37 U/L   ALT 13 0 - 53 U/L   Total Protein 7.6 6.0 - 8.3 g/dL   Albumin 4.7 3.5 - 5.2 g/dL   GFR 32.75 >39.99 mL/min   Calcium 9.3 8.4 - 10.5 mg/dL  Lipid panel  Result Value Ref Range   Cholesterol 176 0 - 200 mg/dL   Triglycerides 23.9 0.0 - 149.0 mg/dL   HDL 45.89 >60.99 mg/dL   VLDL 84.7 0.0 - 59.9 mg/dL   LDL Cholesterol 892 (H) 0 - 99 mg/dL  Total CHOL/HDL Ratio 3    NonHDL 122.29   PSA  Result Value Ref Range   PSA 7.04 (H) 0.10 - 4.00 ng/mL  Urinalysis, Routine w reflex microscopic  Result  Value Ref Range   Color, Urine YELLOW Yellow;Lt. Yellow;Straw;Dark Yellow;Amber;Green;Red;Brown   APPearance CLEAR Clear;Turbid;Slightly Cloudy;Cloudy   Specific Gravity, Urine <=1.005 (A) 1.000 - 1.030   pH 6.5 5.0 - 8.0   Total Protein, Urine NEGATIVE Negative   Urine Glucose NEGATIVE Negative   Ketones, ur NEGATIVE Negative   Bilirubin Urine NEGATIVE Negative   Hgb urine dipstick NEGATIVE Negative   Urobilinogen, UA 0.2 0.0 - 1.0   Leukocytes,Ua NEGATIVE Negative   Nitrite NEGATIVE Negative   WBC, UA 0-2/hpf 0-2/hpf   RBC / HPF none seen 0-2/hpf  Hemoglobin A1c  Result Value Ref Range   Hgb A1c MFr Bld 4.8 4.6 - 6.5 %      The 10-year ASCVD risk score (Arnett DK, et al., 2019) is: 10.6%    Assessment & Plan:   Healthcare maintenance -     CBC with Differential/Platelet -     Urinalysis, Routine w reflex microscopic  Screening for cholesterol level -     Comprehensive metabolic panel with GFR -     Lipid panel  Benign prostatic hyperplasia without lower urinary tract symptoms -     PSA -     Urinalysis, Routine w reflex microscopic  Elevated glucose -     Comprehensive metabolic panel with GFR -     Hemoglobin A1c  Erectile dysfunction due to arterial insufficiency -     Tadalafil ; Take 0.5-1 tablets (10-20 mg total) by mouth every other day as needed for erectile dysfunction.  Dispense: 10 tablet; Refill: 11  Change in voice -     Ambulatory referral to ENT  Elevated PSA, less than 10 ng/ml -     Ambulatory referral to Urology    Return in about 1 year (around 05/09/2025), or if symptoms worsen or fail to improve.  Encouraged him to reconsider recommended vaccinations.  Tdap through his pharmacy.  Continue regular physical activity.  Will try tadalafil  for ED.  ENT referral for change in his voice.  Discussed the possibility of a urology referral for further elevation of the PSA.  Information was given on health maintenance and disease prevention.  Elsie Sim Lent, MD

## 2024-05-11 ENCOUNTER — Ambulatory Visit: Payer: Self-pay | Admitting: Family Medicine

## 2024-05-11 NOTE — Addendum Note (Signed)
 Addended by: BERNETA ELSIE LABOR on: 05/11/2024 08:18 AM   Modules accepted: Orders

## 2024-05-16 NOTE — Telephone Encounter (Signed)
 Copied from CRM [id number]. Topic: Referral - Question >> May 15, 2024  4:22 PM Frederich PARAS wrote: Reason for CRM: pt calling about referral for  ent , pt would like to get a update on it. [phone] . Pt is open to mychart response.

## 2024-05-18 NOTE — Addendum Note (Signed)
 Addended by: BERNETA ELSIE LABOR on: 05/18/2024 08:07 AM   Modules accepted: Orders

## 2024-05-25 DIAGNOSIS — Z1211 Encounter for screening for malignant neoplasm of colon: Secondary | ICD-10-CM | POA: Diagnosis not present

## 2024-05-25 DIAGNOSIS — K5900 Constipation, unspecified: Secondary | ICD-10-CM | POA: Diagnosis not present

## 2024-05-25 DIAGNOSIS — K5730 Diverticulosis of large intestine without perforation or abscess without bleeding: Secondary | ICD-10-CM | POA: Diagnosis not present

## 2024-06-26 DIAGNOSIS — R399 Unspecified symptoms and signs involving the genitourinary system: Secondary | ICD-10-CM | POA: Diagnosis not present

## 2024-06-26 DIAGNOSIS — N528 Other male erectile dysfunction: Secondary | ICD-10-CM | POA: Diagnosis not present

## 2024-06-26 DIAGNOSIS — R972 Elevated prostate specific antigen [PSA]: Secondary | ICD-10-CM | POA: Diagnosis not present

## 2024-06-27 ENCOUNTER — Encounter (INDEPENDENT_AMBULATORY_CARE_PROVIDER_SITE_OTHER): Payer: Self-pay | Admitting: Otolaryngology

## 2024-06-27 ENCOUNTER — Ambulatory Visit (INDEPENDENT_AMBULATORY_CARE_PROVIDER_SITE_OTHER): Admitting: Otolaryngology

## 2024-06-27 VITALS — BP 155/96 | HR 74 | Temp 98.0°F | Ht 73.0 in | Wt 165.0 lb

## 2024-06-27 DIAGNOSIS — Z87891 Personal history of nicotine dependence: Secondary | ICD-10-CM

## 2024-06-27 DIAGNOSIS — R490 Dysphonia: Secondary | ICD-10-CM | POA: Diagnosis not present

## 2024-06-27 DIAGNOSIS — J385 Laryngeal spasm: Secondary | ICD-10-CM

## 2024-06-27 DIAGNOSIS — J387 Other diseases of larynx: Secondary | ICD-10-CM

## 2024-06-27 NOTE — Progress Notes (Signed)
 Dear Dr. Berneta, Here is my assessment for our mutual patient, Raymond Stein. Thank you for allowing me the opportunity to care for your patient. Please do not hesitate to contact me should you have any other questions. Sincerely, Dr. Eldora Blanch  Otolaryngology Clinic Note Referring provider: Dr. Berneta HPI:  Initial visit (06/2024): Discussed the use of AI scribe software for clinical note transcription with the patient, who gave verbal consent to proceed.  History of Present Illness Raymond Stein is a 68 year old male who presents with persistent hoarseness.  He experiences persistent hoarseness that worsens with speech use and improves with voice rest. Feels like it is getting worse. He has never experienced complete voice loss. There is no throat pain, shortness of breath, hemoptysis, otalgia, neck masses, or dysphagia. Denies GERD sx, and has not had any recent medication changes. Ongoing for several months. No recent intubations. He drinks about two to three glasses of water a day and occasionally drinks coffee and soda. He quit smoking 35 years ago after smoking nearly a pack a day for 15 years. He coughs up phlegm in the morning but does not have a persistent cough otherwise. Denies throat clearing. Has not tried anything for this.   ENT Surgery: no Personal or FHx of bleeding dz or anesthesia difficulty: no  GLP-1: no AP/AC: no  Tobacco: prior, quit  PMHx: Back Pain Independent Review of Additional Tests or Records:  Dr. Berneta (05/09/2024): noted change in his voice, quit tobacco 30 years ago; otherwise does not appear to have sx; Dx: Hoarseness; Rx: ref to ENT CBC and CMP 05/09/2024: WBC 5.0, BUN/Cr 8/1.14 PMH/Meds/All/SocHx/FamHx/ROS:  History reviewed. No pertinent past medical history.   Past Surgical History:  Procedure Laterality Date   BACK SURGERY  02/2020   Dr. Cheryle    Family History  Problem Relation Age of Onset   Cancer Mother    Heart failure Father       Social Connections: Socially Integrated (05/08/2024)   Social Connection and Isolation Panel    Frequency of Communication with Friends and Family: More than three times a week    Frequency of Social Gatherings with Friends and Family: Three times a week    Attends Religious Services: More than 4 times per year    Active Member of Clubs or Organizations: Yes    Attends Banker Meetings: More than 4 times per year    Marital Status: Married      Current Outpatient Medications:    tadalafil  (CIALIS ) 20 MG tablet, Take 0.5-1 tablets (10-20 mg total) by mouth every other day as needed for erectile dysfunction., Disp: 10 tablet, Rfl: 11   meloxicam  (MOBIC ) 15 MG tablet, TAKE 1 TABLET BY MOUTH DAILY AS NEEDED FOR PAIN (Patient not taking: Reported on 06/27/2024), Disp: 30 tablet, Rfl: 1   Physical Exam:   BP (!) 155/96 (BP Location: Left Arm, Patient Position: Sitting, Cuff Size: Normal)   Pulse 74   Temp 98 F (36.7 C) (Oral)   Ht 6' 1 (1.854 m)   Wt 165 lb (74.8 kg)   SpO2 97%   BMI 21.77 kg/m   Salient findings:  CN II-XII intact Bilateral EAC clear and TM intact with well pneumatized middle ear spaces Anterior rhinoscopy: Septum intact; bilateral inferior turbinates without significant hypertrophy No lesions of oral cavity/oropharynx No obviously palpable neck masses/lymphadenopathy/thyromegaly No respiratory distress or stridor; voice quality class 2; TFL was indicated to better evaluate the proximal airway, given  the patient's history and exam findings, and is detailed below.  Seprately Identifiable Procedures:  Prior to initiating any procedures, risks/benefits/alternatives were explained to the patient and verbal consent obtained. Procedure Note Pre-procedure diagnosis:  Dysphonia Post-procedure diagnosis: Same Procedure: Transnasal Fiberoptic Laryngoscopy, CPT 31575 - Mod 25 Indication: see above Complications: None apparent EBL: 0 mL  The procedure  was undertaken to further evaluate the patient's complaint above, with mirror exam inadequate for appropriate examination due to gag reflex and poor patient tolerance  Procedure:  Patient was identified as correct patient. Verbal consent was obtained. The nose was sprayed with oxymetazoline and 4% lidocaine. The The flexible laryngoscope was passed through the nose to view the nasal cavity, pharynx (oropharynx, hypopharynx) and larynx.  The larynx was examined at rest and during multiple phonatory tasks. Documentation was obtained and reviewed with patient. The scope was removed. The patient tolerated the procedure well.  Findings: The nasal cavity and nasopharynx did not reveal any masses or lesions, mucosa appeared to be without obvious lesions. The tongue base, pharyngeal walls, piriform sinuses, vallecula, epiglottis and postcricoid region are normal in appearance; The visualized portion of the subglottis and proximal trachea is widely patent. The vocal folds are mobile bilaterally. There are no lesions on the free edge of the vocal folds nor elsewhere in the larynx worrisome for malignancy. 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 male with:  1. Dysphonia   2. Muscle tension dysphonia   3. Laryngeal spasm   4. Presbylarynx    Worsening dysphonia, slowly, chronic. TFL reassuring; based on sx, appears to have changes consistent with VF atrophy and presbyphonia. Likely worse due to poor hydration We discussed options including observation, Voice Rx, and augmentation (too early) He opted for Voice Therapy Will see him back 3 months after therapy completion to assess; return precautions discussed  See below regarding exact medications prescribed this encounter including dosages and route: No orders of the defined types  were placed in this encounter.     Thank you for allowing me the opportunity to care for your patient. Please do not hesitate to contact me should you have any other questions.  Sincerely, Eldora Blanch, MD Otolaryngologist (ENT), [facility] Phone: [phone] Fax: [phone]  07/09/2024, 4:47 PM   MDM:  Level 4 - 99204 Complexity/Problems addressed: mod - chronic problem, worsening Data complexity: mod - independent review of note, labs - Morbidity: low  - Prescription Drug prescribed or managed: n

## 2024-06-30 ENCOUNTER — Other Ambulatory Visit: Payer: Self-pay | Admitting: Urology

## 2024-06-30 DIAGNOSIS — K635 Polyp of colon: Secondary | ICD-10-CM | POA: Diagnosis not present

## 2024-06-30 DIAGNOSIS — K5730 Diverticulosis of large intestine without perforation or abscess without bleeding: Secondary | ICD-10-CM | POA: Diagnosis not present

## 2024-06-30 DIAGNOSIS — Z1211 Encounter for screening for malignant neoplasm of colon: Secondary | ICD-10-CM | POA: Diagnosis not present

## 2024-06-30 DIAGNOSIS — R972 Elevated prostate specific antigen [PSA]: Secondary | ICD-10-CM

## 2024-06-30 DIAGNOSIS — K648 Other hemorrhoids: Secondary | ICD-10-CM | POA: Diagnosis not present

## 2024-06-30 LAB — HM COLONOSCOPY

## 2024-07-31 ENCOUNTER — Ambulatory Visit
Admission: RE | Admit: 2024-07-31 | Discharge: 2024-07-31 | Disposition: A | Discharge status: Home / Self Care | Source: Ambulatory Visit | Attending: Urology | Admitting: Urology

## 2024-07-31 DIAGNOSIS — R972 Elevated prostate specific antigen [PSA]: Secondary | ICD-10-CM | POA: Diagnosis not present

## 2024-07-31 MED ORDER — GADOPICLENOL 0.5 MMOL/ML IV SOLN
8.0000 mL | Freq: Once | INTRAVENOUS | Status: AC | PRN
  Administered 2024-07-31: 8 mL via INTRAVENOUS

## 2024-09-04 ENCOUNTER — Ambulatory Visit

## 2024-09-04 VITALS — Ht 73.0 in | Wt 165.0 lb

## 2024-09-04 DIAGNOSIS — Z0000 Encounter for general adult medical examination without abnormal findings: Secondary | ICD-10-CM

## 2024-09-04 NOTE — Patient Instructions (Addendum)
 [name],  Thank you for taking the time for your Medicare Wellness Visit. I appreciate your continued commitment to your health goals. Please review the care plan we discussed, and feel free to reach out if I can assist you further.  Please note that Annual Wellness Visits do not include a physical exam. Some assessments may be limited, especially if the visit was conducted virtually. If needed, we may recommend an in-person follow-up with your provider.  Ongoing Care Seeing your primary care provider every 3 to 6 months helps us  monitor your health and provide consistent, personalized care.   Referrals If a referral was made during today's visit and you haven't received any updates within two weeks, please contact the referred provider directly to check on the status.  Recommended Screenings:  Health Maintenance  Topic Date Due   Cologuard (Stool DNA test)  Never done   Zoster (Shingles) Vaccine (1 of 2) Never done   DTaP/Tdap/Td vaccine (2 - Td or Tdap) 06/22/2023   COVID-19 Vaccine (1 - 2025-26 season) Never done   Flu Shot  12/05/2024*   Pneumococcal Vaccine for age over 72 (2 of 2 - PCV) 05/09/2025*   Medicare Annual Wellness Visit  09/04/2025   Meningitis B Vaccine  Aged Out   Hepatitis C Screening  Discontinued  *Topic was postponed. The date shown is not the original due date.       09/04/2024    1:46 PM  Advanced Directives  Does Patient Have a Medical Advance Directive? No  Would patient like information on creating a medical advance directive? No - Patient declined    Vision: Annual vision screenings are recommended for early detection of glaucoma, cataracts, and diabetic retinopathy. These exams can also reveal signs of chronic conditions such as diabetes and high blood pressure.  Dental: Annual dental screenings help detect early signs of oral cancer, gum disease, and other conditions linked to overall health, including heart disease and diabetes.  Please see  the attached documents for additional preventive care recommendations.

## 2024-09-04 NOTE — Progress Notes (Signed)
 "  Chief Complaint  Patient presents with   Medicare Wellness     Subjective:   Raymond Stein is a 68 y.o. male who presents for a Medicare Annual Wellness Visit.  Visit info / Clinical Intake: Medicare Wellness Visit Type:: Initial Annual Wellness Visit Persons participating in visit and providing information:: patient & caregiver Medicare Wellness Visit Mode:: Telephone If telephone:: video declined Since this visit was completed virtually, some vitals may be partially provided or unavailable. Missing vitals are due to the limitations of the virtual format.: Documented vitals are patient reported If Telephone or Video please confirm:: I connected with patient using audio/video enable telemedicine. I verified patient identity with two identifiers, discussed telehealth limitations, and patient agreed to proceed. Patient Location:: home Provider Location:: office Interpreter Needed?: No Pre-visit prep was completed: yes AWV questionnaire completed by patient prior to visit?: no Living arrangements:: lives with spouse/significant other Patient's Overall Health Status Rating: good Typical amount of pain: some Does pain affect daily life?: (!) yes Are you currently prescribed opioids?: no  Dietary Habits and Nutritional Risks How many meals a day?: (!) 1 Eats fruit and vegetables daily?: yes Most meals are obtained by: preparing own meals In the last 2 weeks, have you had any of the following?: (!) nausea, vomiting, diarrhea (some nausea today and a little last week, resolved) Diabetic:: no  Functional Status Activities of Daily Living (to include ambulation/medication): Independent Ambulation: Independent Medication Administration: Independent Home Management (perform basic housework or laundry): Needs assistance (comment) Manage your own finances?: (!) no Primary transportation is: driving Concerns about vision?: no *vision screening is required for WTM* Concerns about  hearing?: no  Fall Screening Falls in the past year?: 0 Number of falls in past year: 0 Was there an injury with Fall?: 0 Fall Risk Category Calculator: 0 Patient Fall Risk Level: Low Fall Risk  Fall Risk Patient at Risk for Falls Due to: No Fall Risks Fall risk Follow up: Falls prevention discussed; Falls evaluation completed  Home and Transportation Safety: All rugs have non-skid backing?: yes All stairs or steps have railings?: yes Grab bars in the bathtub or shower?: (!) no Have non-skid surface in bathtub or shower?: (!) no Good home lighting?: yes Regular seat belt use?: yes Hospital stays in the last year:: no  Cognitive Assessment Difficulty concentrating, remembering, or making decisions? : no Will 6CIT or Mini Cog be Completed: yes What year is it?: 0 points What month is it?: 0 points Give patient an address phrase to remember (5 components): [address] About what time is it?: 0 points Count backwards from 20 to 1: 0 points Say the months of the year in reverse: 0 points Repeat the address phrase from earlier: 2 points 6 CIT Score: 2 points  Advance Directives (For Healthcare) Does Patient Have a Medical Advance Directive?: No Would patient like information on creating a medical advance directive?: No - Patient declined  Reviewed/Updated  Reviewed/Updated: Reviewed All (Medical, Surgical, Family, Medications, Allergies, Care Teams, Patient Goals)    Allergies (verified) Patient has no known allergies.   Current Medications (verified) Outpatient Encounter Medications as of 09/04/2024  Medication Sig   meloxicam  (MOBIC ) 15 MG tablet TAKE 1 TABLET BY MOUTH DAILY AS NEEDED FOR PAIN (Patient not taking: Reported on 09/04/2024)   tadalafil  (CIALIS ) 20 MG tablet Take 0.5-1 tablets (10-20 mg total) by mouth every other day as needed for erectile dysfunction. (Patient not taking: Reported on 09/04/2024)   No facility-administered  encounter medications  on file as of 09/04/2024.    History: History reviewed. No pertinent past medical history. Past Surgical History:  Procedure Laterality Date   BACK SURGERY  02/2020   Dr. Cheryle   Family History  Problem Relation Age of Onset   Cancer Mother    Heart failure Father    Social History   Occupational History   Not on file  Tobacco Use   Smoking status: Former    Current packs/day: 0.00    Types: Cigarettes    Start date: 03/07/1959    Quit date: 03/06/1989    Years since quitting: 35.5   Smokeless tobacco: Never  Vaping Use   Vaping status: Never Used  Substance and Sexual Activity   Alcohol use: No   Drug use: Never   Sexual activity: Yes   Tobacco Counseling Counseling given: Not Answered  SDOH Screenings   Food Insecurity: No Food Insecurity (09/04/2024)  Housing: Unknown (09/04/2024)  Transportation Needs: No Transportation Needs (09/04/2024)  Utilities: Not At Risk (09/04/2024)  Alcohol Screen: Low Risk (09/04/2024)  Depression (PHQ2-9): Low Risk (09/04/2024)  Financial Resource Strain: Low Risk (09/04/2024)  Physical Activity: Sufficiently Active (09/04/2024)  Social Connections: Socially Integrated (09/04/2024)  Stress: No Stress Concern Present (09/04/2024)  Tobacco Use: Medium Risk (09/04/2024)  Health Literacy: Adequate Health Literacy (09/04/2024)   See flowsheets for full screening details  Depression Screen PHQ 2 & 9 Depression Scale- Over the past 2 weeks, how often have you been bothered by any of the following problems? Little interest or pleasure in doing things: 0 Feeling down, depressed, or hopeless (PHQ Adolescent also includes...irritable): 0 PHQ-2 Total Score: 0 Trouble falling or staying asleep, or sleeping too much: 3 (up going to the bathroom) Feeling tired or having little energy: 0 Poor appetite or overeating (PHQ Adolescent also includes...weight loss): 0 Feeling bad about yourself - or that you are a failure or have let yourself  or your family down: 0 Trouble concentrating on things, such as reading the newspaper or watching television (PHQ Adolescent also includes...like school work): 0 Moving or speaking so slowly that other people could have noticed. Or the opposite - being so fidgety or restless that you have been moving around a lot more than usual: 0 Thoughts that you would be better off dead, or of hurting yourself in some way: 0 PHQ-9 Total Score: 3 If you checked off any problems, how difficult have these problems made it for you to do your work, take care of things at home, or get along with other people?: Not difficult at all  Depression Treatment Depression Interventions/Treatment : EYV7-0 Score <4 Follow-up Not Indicated     Goals Addressed             This Visit's Progress    Patient Stated       09/04/2024, wants to continue and eat properly, live long and eliminate back pain             Objective:    Today's Vitals   09/04/24 1340  Weight: 165 lb (74.8 kg)  Height: 6' 1 (1.854 m)   Body mass index is 21.77 kg/m.  Hearing/Vision screen Hearing Screening - Comments:: Denies hearing issues Vision Screening - Comments:: Regular eye exams, Dr. Abigail Immunizations and Health Maintenance Health Maintenance  Topic Date Due   Fecal DNA (Cologuard)  Never done   Zoster Vaccines- Shingrix (1 of 2) Never done   DTaP/Tdap/Td (2 - Td or Tdap) 06/22/2023  COVID-19 Vaccine (1 - 2025-26 season) Never done   Influenza Vaccine  12/05/2024 (Originally 04/07/2024)   Pneumococcal Vaccine: 50+ Years (2 of 2 - PCV) 05/09/2025 (Originally 11/07/2019)   Medicare Annual Wellness (AWV)  09/04/2025   Meningococcal B Vaccine  Aged Out   Colonoscopy  Discontinued   Hepatitis C Screening  Discontinued        Assessment/Plan:  This is a routine wellness examination for Raymond Stein.  Patient Care Team: Berneta Elsie Sayre, MD as PCP - General (Family Medicine) Abigail, Maude [facility])  I have  personally reviewed and noted the following in the patients chart:   Medical and social history Use of alcohol, tobacco or illicit drugs  Current medications and supplements including opioid prescriptions. Functional ability and status Nutritional status Physical activity Advanced directives List of other physicians Hospitalizations, surgeries, and ER visits in previous 12 months Vitals Screenings to include cognitive, depression, and falls Referrals and appointments  Orders Placed This Encounter  Procedures   HM COLONOSCOPY    This external order was created through the Results Console.   In addition, I have reviewed and discussed with patient certain preventive protocols, quality metrics, and best practice recommendations. A written personalized care plan for preventive services as well as general preventive health recommendations were provided to patient.   Ardella FORBES Dawn, LPN   87/[id number]/7974   Return in 1 year (on 09/04/2025).  After Visit Summary: (MyChart) Due to this being a telephonic visit, the after visit summary with patients personalized plan was offered to patient via MyChart   Nurse Notes: Vaccines not given: TDAP, shingles and covid declined today  "

## 2024-09-05 ENCOUNTER — Other Ambulatory Visit (HOSPITAL_COMMUNITY): Payer: Self-pay | Admitting: Urology

## 2024-09-05 DIAGNOSIS — C61 Malignant neoplasm of prostate: Secondary | ICD-10-CM

## 2024-09-13 ENCOUNTER — Encounter (HOSPITAL_COMMUNITY)
Admission: RE | Admit: 2024-09-13 | Discharge: 2024-09-13 | Disposition: A | Discharge status: Home / Self Care | Source: Ambulatory Visit | Attending: Urology | Admitting: Urology

## 2024-09-13 DIAGNOSIS — C61 Malignant neoplasm of prostate: Secondary | ICD-10-CM | POA: Insufficient documentation

## 2024-09-13 MED ORDER — FLOTUFOLASTAT F 18 GALLIUM 296-5846 MBQ/ML IV SOLN
8.3000 | Freq: Once | INTRAVENOUS | Status: AC
  Administered 2024-09-13: 8.3 via INTRAVENOUS
  Filled 2024-09-13: qty 9

## 2024-10-05 ENCOUNTER — Encounter: Payer: Self-pay | Admitting: Radiation Oncology

## 2024-10-05 NOTE — Progress Notes (Incomplete)
 GU Location of Tumor / Histology:  Prostate Ca  If Prostate Cancer, Gleason Score is (4 + 4) and PSA is (6.4 on 06/26/2024)  Raymond Stein presented as referral from Dr. Steffan BROCKS. [facility]) elevated PSA.  Biopsies        09/13/2024 Dr. Steffan BROCKS. [facility] PET Skull to Mid Thigh CLINICAL DATA:  PSA: 7.04 - from media notes from Dr. Shane. [facility] PET for Prostate cancer.  Prostate bx 12/17.  IMPRESSION: 1. 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 2. No evidence of metastatic adenopathy in the pelvis or periaortic retroperitoneum. 3. No evidence of visceral metastasis or skeletal metastasis.   07/31/2024 Dr. Steffan BROCKS. [name]  Prostate with/without Contrast CLINICAL DATA:  Elevated PSA.   IMPRESSION: 1.9 cm lesion in the right apical anterior peripheral zone, anterior fibromuscular stroma, and anterior transition zone, highly suspicious for high-grade carcinoma with extracapsular extension. PI-RADS 5 (v2.1): Very high (clinically significant cancer highly likely).  No evidence of pelvic lymphadenopathy or bone metastases.  Past/Anticipated interventions by urology, if any:  Dr. Steffan BROCKS. [facility]     Past/Anticipated interventions by medical oncology, if any:  N/A  Weight changes, if any: {:18581}  IPSS: SHIM:  Bowel/Bladder complaints, if any: {:18581}   Nausea/Vomiting, if any: {:18581}  Pain issues, if any:  {:18581}  SAFETY ISSUES: Prior radiation? {:18581} Pacemaker/ICD? {:18581} Possible current pregnancy? Male Is the patient on methotrexate? No  Current Complaints / other details:    30 minutes spent total, including time for meaningful use questions, reviewing medication, as well as spent in face-to-face time in nurse evaluation with the patient.

## 2024-10-10 ENCOUNTER — Ambulatory Visit

## 2024-10-10 ENCOUNTER — Ambulatory Visit: Admitting: Radiation Oncology

## 2024-10-12 NOTE — Progress Notes (Incomplete)
 GU Location of Tumor / Histology:  Prostate Ca  If Prostate Cancer, Gleason Score is (4 + 4) and PSA is (6.4 on 06/26/2024)  Raymond Stein presented as referral from Dr. Steffan BROCKS. [facility]) elevated PSA.  Biopsies        09/13/2024 Dr. Steffan BROCKS. [facility] PET Skull to Mid Thigh CLINICAL DATA:  PSA: 7.04 - from media notes from Dr. Shane. [facility] PET for Prostate cancer.  Prostate bx 12/17.  IMPRESSION: 1. 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 2. No evidence of metastatic adenopathy in the pelvis or periaortic retroperitoneum. 3. No evidence of visceral metastasis or skeletal metastasis.   07/31/2024 Dr. Steffan BROCKS. [name]  Prostate with/without Contrast CLINICAL DATA:  Elevated PSA.   IMPRESSION: 1.9 cm lesion in the right apical anterior peripheral zone, anterior fibromuscular stroma, and anterior transition zone, highly suspicious for high-grade carcinoma with extracapsular extension. PI-RADS 5 (v2.1): Very high (clinically significant cancer highly likely).  No evidence of pelvic lymphadenopathy or bone metastases.  Past/Anticipated interventions by urology, if any:  Dr. Steffan BROCKS. [facility]     Past/Anticipated interventions by medical oncology, if any:  N/A  Weight changes, if any: {:18581}  IPSS: SHIM:  Bowel/Bladder complaints, if any: {:18581}   Nausea/Vomiting, if any: {:18581}  Pain issues, if any:  {:18581}  SAFETY ISSUES: Prior radiation? {:18581} Pacemaker/ICD? {:18581} Possible current pregnancy? Male Is the patient on methotrexate? No  Current Complaints / other details:    30 minutes spent total, including time for meaningful use questions, reviewing medication, as well as spent in face-to-face time in nurse evaluation with the patient.

## 2024-10-20 ENCOUNTER — Ambulatory Visit: Admitting: Radiation Oncology

## 2024-10-20 ENCOUNTER — Ambulatory Visit

## 2025-05-10 ENCOUNTER — Encounter: Admitting: Family Medicine

## 2025-09-10 ENCOUNTER — Ambulatory Visit
